# Patient Record
Sex: Female | Born: 1970 | Race: Black or African American | Hispanic: No | Smoking: Never smoker
Health system: Southern US, Community
[De-identification: ages and names within clinical notes are randomized; demographics above are authoritative.]

## PROBLEM LIST (undated history)

## (undated) DIAGNOSIS — O24419 Gestational diabetes mellitus in pregnancy, unspecified control: Secondary | ICD-10-CM

## (undated) HISTORY — DX: Gestational diabetes mellitus in pregnancy, unspecified control: O24.419

## (undated) HISTORY — PX: ABDOMINAL HYSTERECTOMY: SHX81

## (undated) HISTORY — PX: HYSTERECTOMY ABDOMINAL WITH SALPINGECTOMY: SHX6725

---

## 2015-10-07 ENCOUNTER — Other Ambulatory Visit: Payer: Self-pay | Admitting: Family Medicine

## 2015-10-07 DIAGNOSIS — Z1231 Encounter for screening mammogram for malignant neoplasm of breast: Secondary | ICD-10-CM

## 2015-10-21 ENCOUNTER — Ambulatory Visit
Admission: RE | Admit: 2015-10-21 | Discharge: 2015-10-21 | Disposition: A | Discharge status: Home / Self Care | Payer: 59 | Source: Ambulatory Visit | Attending: Family Medicine | Admitting: Family Medicine

## 2015-10-21 DIAGNOSIS — Z1231 Encounter for screening mammogram for malignant neoplasm of breast: Secondary | ICD-10-CM

## 2016-10-18 ENCOUNTER — Other Ambulatory Visit: Payer: Self-pay | Admitting: Family Medicine

## 2016-10-18 DIAGNOSIS — Z1231 Encounter for screening mammogram for malignant neoplasm of breast: Secondary | ICD-10-CM

## 2016-10-25 ENCOUNTER — Ambulatory Visit
Admission: RE | Admit: 2016-10-25 | Discharge: 2016-10-25 | Disposition: A | Discharge status: Home / Self Care | Payer: 59 | Source: Ambulatory Visit | Attending: Family Medicine | Admitting: Family Medicine

## 2016-10-25 DIAGNOSIS — Z1231 Encounter for screening mammogram for malignant neoplasm of breast: Secondary | ICD-10-CM

## 2017-10-23 ENCOUNTER — Other Ambulatory Visit: Payer: Self-pay | Admitting: Family Medicine

## 2017-10-23 DIAGNOSIS — Z139 Encounter for screening, unspecified: Secondary | ICD-10-CM

## 2017-11-12 ENCOUNTER — Ambulatory Visit
Admission: RE | Admit: 2017-11-12 | Discharge: 2017-11-12 | Disposition: A | Discharge status: Home / Self Care | Payer: 59 | Source: Ambulatory Visit | Attending: Family Medicine | Admitting: Family Medicine

## 2017-11-12 DIAGNOSIS — Z139 Encounter for screening, unspecified: Secondary | ICD-10-CM

## 2018-11-11 ENCOUNTER — Other Ambulatory Visit: Payer: Self-pay | Admitting: Family Medicine

## 2018-11-11 DIAGNOSIS — Z1231 Encounter for screening mammogram for malignant neoplasm of breast: Secondary | ICD-10-CM

## 2018-11-18 ENCOUNTER — Ambulatory Visit
Admission: RE | Admit: 2018-11-18 | Discharge: 2018-11-18 | Disposition: A | Discharge status: Home / Self Care | Payer: 59 | Source: Ambulatory Visit | Attending: Family Medicine | Admitting: Family Medicine

## 2018-11-18 DIAGNOSIS — Z1231 Encounter for screening mammogram for malignant neoplasm of breast: Secondary | ICD-10-CM

## 2019-10-20 ENCOUNTER — Other Ambulatory Visit: Payer: Self-pay | Admitting: Family Medicine

## 2019-10-20 DIAGNOSIS — Z1231 Encounter for screening mammogram for malignant neoplasm of breast: Secondary | ICD-10-CM

## 2019-11-19 ENCOUNTER — Other Ambulatory Visit: Payer: Self-pay

## 2019-11-19 ENCOUNTER — Ambulatory Visit
Admission: RE | Admit: 2019-11-19 | Discharge: 2019-11-19 | Disposition: A | Discharge status: Home / Self Care | Payer: 59 | Source: Ambulatory Visit | Attending: Family Medicine | Admitting: Family Medicine

## 2019-11-19 DIAGNOSIS — Z1231 Encounter for screening mammogram for malignant neoplasm of breast: Secondary | ICD-10-CM

## 2020-03-31 ENCOUNTER — Ambulatory Visit: Payer: 59 | Admitting: Cardiology

## 2020-03-31 ENCOUNTER — Encounter: Payer: Self-pay | Admitting: Cardiology

## 2020-03-31 ENCOUNTER — Other Ambulatory Visit: Payer: Self-pay

## 2020-03-31 VITALS — BP 120/59 | HR 74 | Resp 17 | Ht 60.0 in | Wt 180.4 lb

## 2020-03-31 DIAGNOSIS — Z8249 Family history of ischemic heart disease and other diseases of the circulatory system: Secondary | ICD-10-CM

## 2020-03-31 DIAGNOSIS — R072 Precordial pain: Secondary | ICD-10-CM

## 2020-03-31 NOTE — Progress Notes (Signed)
Date:  03/31/2020   ID:  Jo Williamson, DOB [birth year], MRN [id number]  PCP:  Jo Landsman, MD  Cardiologist:  Jo Kras, DO, [facility] (established care 03/30/2020)  REASON FOR CONSULT: Chest pain  REQUESTING PHYSICIAN:  Jo Williamson, [address] [place],  [place] [address]  Chief Complaint  Patient presents with  . Chest Pain  . left arm pain  . New Patient (Initial Visit)    HPI  Jo Williamson is a 49 y.o. female who presents to the office with a chief complaint of " chest pain." She is referred to the office at the request of Jo Landsman, MD. Patient's past medical history and cardiovascular risk factors include: Family history of premature coronary artery disease, obesity due to excess calories.  Patient is referred to the office at the request of her primary care provider for evaluation of chest pain and left arm pain. Patient states that she started having symptoms back in June when she woke up with left-sided chest pain and left arm pain.  She describes the chest discomfort as a sharp-like pain, 10 out of 10 in intensity lasted for couple hours and self-limited.  No improving or worsening factors.  Despite this happening in July 2021 she continues to have residual left arm pain around the left elbow.  She had mentioned this to her primary care provider and was referred to cardiology for further evaluation.  Functionally patient is able to go up flights of stairs and does go out for when she is at work and after coming home.  She does not have effort related chest pain or shortness of breath.  No significant decrease in overall exercise endurance.  Mom passed away from MI at age of 15 back in 2011.   Denies prior history of coronary artery disease, myocardial infarction, congestive heart failure, deep venous thrombosis, pulmonary embolism, stroke, transient ischemic attack.  FUNCTIONAL STATUS: Tries to walk daily during work breaks and after work [place]  [place].    ALLERGIES: Allergies  Allergen Reactions  . Shrimp [Shellfish Allergy] Hives    MEDICATION LIST PRIOR TO VISIT: Current Meds  Medication Sig  . Ascorbic Acid (VITAMIN C) 1000 MG tablet Take 1,000 mg by mouth daily.  . Multiple Vitamins-Minerals (WOMENS MULTIVITAMIN PO) Take 1 tablet by mouth daily.     PAST MEDICAL HISTORY: Past Medical History:  Diagnosis Date  . Gestational diabetes     PAST SURGICAL HISTORY: Past Surgical History:  Procedure Laterality Date  . CESAREAN SECTION    . HYSTERECTOMY ABDOMINAL WITH SALPINGECTOMY      FAMILY HISTORY: The patient family history includes Diabetes in her father; Heart attack in her mother.  SOCIAL HISTORY:  The patient  reports that she has never smoked. She has never used smokeless tobacco. She reports current alcohol use. She reports previous drug use.  REVIEW OF SYSTEMS: Review of Systems  Constitutional: Negative for chills and fever.  HENT: Negative for hoarse voice and nosebleeds.   Eyes: Negative for discharge, double vision and pain.  Cardiovascular: Negative for chest pain, claudication, dyspnea on exertion, leg swelling, near-syncope, orthopnea, palpitations, paroxysmal nocturnal dyspnea and syncope.       Leg arm pain.   Respiratory: Negative for hemoptysis and shortness of breath.   Musculoskeletal: Negative for muscle cramps and myalgias.  Gastrointestinal: Negative for abdominal pain, constipation, diarrhea, hematemesis, hematochezia, melena, nausea and vomiting.  Neurological: Negative for dizziness and light-headedness.    PHYSICAL EXAM: Vitals  with BMI 03/31/2020  Height '5\' 0"'   Weight 180 lbs 6 oz  BMI 41.74  Systolic 081  Diastolic 59  Pulse 74    CONSTITUTIONAL: Well-developed and well-nourished. No acute distress.  SKIN: Skin is warm and dry. No rash noted. No cyanosis. No pallor. No jaundice HEAD: Normocephalic and atraumatic.  EYES: No scleral icterus MOUTH/THROAT: Moist oral  membranes.  NECK: No JVD present. No thyromegaly noted. No carotid bruits  LYMPHATIC: No visible cervical adenopathy.  CHEST Normal respiratory effort. No intercostal retractions  LUNGS: Clear to auscultation bilaterally. No stridor. No wheezes. No rales.  CARDIOVASCULAR: Regular rate and rhythm, positive S1-S2, no murmurs rubs or gallops appreciated. ABDOMINAL: No apparent ascites.  EXTREMITIES: No peripheral edema  HEMATOLOGIC: No significant bruising NEUROLOGIC: Oriented to person, place, and time. Nonfocal. Normal muscle tone.  PSYCHIATRIC: Normal mood and affect. Normal behavior. Cooperative  CARDIAC DATABASE: EKG: 03/31/2020: Sinus  Rhythm, 75bpm, normal axis, without underlying ischemia or injury pattern.   Echocardiogram: None  Stress Testing: None  Heart Catheterization: None  LABORATORY DATA: External Labs: Collected: 03/04/2020 Creatinine 0.66 mg/dL. eGFR: 119 mL/min per 1.73 m Lipid profile: Total cholesterol 233, triglycerides 67, HDL 58, LDL 159, non-HDL 175 TSH: 3.02  IMPRESSION:    ICD-10-CM   1. Precordial pain  R07.2 EKG 12-Lead    CT CARDIAC SCORING    PCV ECHOCARDIOGRAM COMPLETE    PCV CARDIAC STRESS TEST  2. Family history of premature CAD  Z82.49 CT CARDIAC SCORING  3. Class 2 severe obesity due to excess calories with serious comorbidity and body mass index (BMI) of 35.0 to 35.9 in adult [facility])  E66.01    Z68.35      RECOMMENDATIONS: Jo Williamson is a 49 y.o. female whose past medical history and cardiac risk factors include: Family history of premature coronary artery disease, obesity due to excess calories.  Precordial chest pain and left arm pain:  Patient symptoms of chest discomfort appear to be atypical in nature.    EKG shows normal sinus rhythm without underlying ischemia or injury pattern.  Echocardiogram will be ordered to evaluate for structural heart disease and left ventricular systolic function.  Plan exercise treadmill  stress test to evaluate for exercise-induced ischemia.  Given her family history of premature coronary artery disease recommend coronary artery calcification score for risk stratification.  We discussed that her cholesterol levels are not optimized. Her LDL is 159, non-HDL 175. However, her estimated 10-year risk of ASCVD proximally 1.8% based on the current parameters.  Discussed improving lifestyle modifications to improve her modifiable cardiovascular risk factors.  Family history of premature CAD: See above  Obesity, due to excess calories: Body mass index is 35.23 kg/m. . I reviewed with the patient the importance of diet, regular physical activity/exercise, weight loss.   . Patient is educated on increas good how you ing physical activity gradually as tolerated.  With the goal of moderate intensity exercise for 30 minutes a day 5 days a week.  FINAL MEDICATION LIST END OF ENCOUNTER: No orders of the defined types were placed in this encounter.   There are no discontinued medications.   Current Outpatient Medications:  .  Ascorbic Acid (VITAMIN C) 1000 MG tablet, Take 1,000 mg by mouth daily., Disp: , Rfl:  .  Multiple Vitamins-Minerals (WOMENS MULTIVITAMIN PO), Take 1 tablet by mouth daily., Disp: , Rfl:   Orders Placed This Encounter  Procedures  . CT CARDIAC SCORING  . PCV CARDIAC STRESS TEST  .  EKG 12-Lead  . PCV ECHOCARDIOGRAM COMPLETE    There are no Patient Instructions on file for this visit.   --Continue cardiac medications as reconciled in final medication list. --No follow-ups on file. Or sooner if needed. --Continue follow-up with your primary care physician regarding the management of your other chronic comorbid conditions.  Patient's questions and concerns were addressed to her satisfaction. She voices understanding of the instructions provided during this encounter.   This note was created using a voice recognition software as a result there may be  grammatical errors inadvertently enclosed that do not reflect the nature of this encounter. Every attempt is made to correct such errors.  Jo Williamson, [place], [facility]  Pager: [phone] Office: [phone]

## 2020-04-05 ENCOUNTER — Other Ambulatory Visit: Payer: Self-pay

## 2020-04-05 ENCOUNTER — Ambulatory Visit: Payer: 59

## 2020-04-05 DIAGNOSIS — R072 Precordial pain: Secondary | ICD-10-CM

## 2020-04-13 ENCOUNTER — Telehealth: Payer: Self-pay

## 2020-04-13 NOTE — Telephone Encounter (Signed)
Called pt to inform her about her echo results. Pt understood.

## 2020-04-13 NOTE — Telephone Encounter (Signed)
-----   Message from [place], [place] sent at 04/11/2020 11:16 PM EDT ----- The left ventricular ejection fraction or pumping activity of the heart is within the normal limit. Details will be reviewed at the next office visit.

## 2020-04-15 ENCOUNTER — Other Ambulatory Visit: Payer: Self-pay

## 2020-04-15 ENCOUNTER — Ambulatory Visit: Payer: 59 | Admitting: Cardiology

## 2020-04-15 ENCOUNTER — Encounter: Payer: Self-pay | Admitting: Cardiology

## 2020-04-15 VITALS — BP 106/55 | HR 63 | Resp 15 | Ht 64.0 in | Wt 182.0 lb

## 2020-04-15 DIAGNOSIS — R072 Precordial pain: Secondary | ICD-10-CM

## 2020-04-15 DIAGNOSIS — E6609 Other obesity due to excess calories: Secondary | ICD-10-CM

## 2020-04-15 DIAGNOSIS — Z8249 Family history of ischemic heart disease and other diseases of the circulatory system: Secondary | ICD-10-CM

## 2020-04-15 DIAGNOSIS — Z712 Person consulting for explanation of examination or test findings: Secondary | ICD-10-CM

## 2020-04-15 NOTE — Progress Notes (Signed)
Date:  04/15/2020   ID:  Vena Rua, DOB 02/25/1971, MRN [id number]  PCP:  Lin Landsman, MD  Cardiologist:  Rex Kras, DO, [facility] (established care 03/30/2020)  Date: 04/15/20 Last Office Visit: 03/31/2020  Chief Complaint  Patient presents with  . Follow-up    2 week  . Results    HPI  BINNIE DROESSLER is a 49 y.o. female who presents to the office with a chief complaint of " reevaluation of chest pain and review test results." Patient's past medical history and cardiovascular risk factors include: Family history of premature coronary artery disease, obesity due to excess calories.  Patient is referred to the office at the request of her primary care provider for evaluation of chest pain and left arm pain.  At last office visit patient was informed that her symptoms were atypical in nature but given her cardiovascular risk factors more importantly including family history of premature coronary artery disease ischemic evaluation was warranted.  Since last office visit patient states her symptoms of chest pain have resolved no further reoccurrence.  Her echocardiogram noted preserved left ventricular systolic function without any significant valvular heart disease details noted below for further reference.  Patient's coronary artery calcification score is 0.  And her exercise treadmill stress test was submaximal as she did not achieve 85% age-predicted maximum heart rate but based on the workload performed stress ECG was negative for ischemia.  Mom passed away from MI at age of 83 back in 2011.   Denies prior history of coronary artery disease, myocardial infarction, congestive heart failure, deep venous thrombosis, pulmonary embolism, stroke, transient ischemic attack.  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    ALLERGIES: Allergies  Allergen Reactions  . Shrimp [Shellfish Allergy] Hives    MEDICATION LIST PRIOR TO  VISIT: Current Meds  Medication Sig  . Ascorbic Acid (VITAMIN C) 1000 MG tablet Take 1,000 mg by mouth daily.  . Multiple Vitamins-Minerals (WOMENS MULTIVITAMIN PO) Take 1 tablet by mouth daily.  . Nutritional Supplements (IMMUNE ENHANCE PO) Take by mouth.     PAST MEDICAL HISTORY: Past Medical History:  Diagnosis Date  . Gestational diabetes     PAST SURGICAL HISTORY: Past Surgical History:  Procedure Laterality Date  . CESAREAN SECTION    . HYSTERECTOMY ABDOMINAL WITH SALPINGECTOMY      FAMILY HISTORY: The patient family history includes Diabetes in her father; Heart attack in her mother.  SOCIAL HISTORY:  The patient  reports that she has never smoked. She has never used smokeless tobacco. She reports current alcohol use. She reports previous drug use.  REVIEW OF SYSTEMS: Review of Systems  Constitutional: Negative for chills and fever.  HENT: Negative for hoarse voice and nosebleeds.   Eyes: Negative for discharge, double vision and pain.  Cardiovascular: Negative for chest pain, claudication, dyspnea on exertion, leg swelling, near-syncope, orthopnea, palpitations, paroxysmal nocturnal dyspnea and syncope.  Respiratory: Negative for hemoptysis and shortness of breath.   Musculoskeletal: Negative for muscle cramps and myalgias.  Gastrointestinal: Negative for abdominal pain, constipation, diarrhea, hematemesis, hematochezia, melena, nausea and vomiting.  Neurological: Negative for dizziness and light-headedness.    PHYSICAL EXAM: Vitals with BMI 04/15/2020 03/31/2020  Height '5\' 4"'  '5\' 0"'   Weight 182 lbs 180 lbs 6 oz  BMI 51.88 41.66  Systolic 063 016  Diastolic 55 59  Pulse 63 74   CONSTITUTIONAL: Well-developed and well-nourished. No acute distress.  SKIN: Skin  is warm and dry. No rash noted. No cyanosis. No pallor. No jaundice HEAD: Normocephalic and atraumatic.  EYES: No scleral icterus MOUTH/THROAT: Moist oral membranes.  NECK: No JVD present. No thyromegaly  noted. No carotid bruits  LYMPHATIC: No visible cervical adenopathy.  CHEST Normal respiratory effort. No intercostal retractions  LUNGS: Clear to auscultation bilaterally. No stridor. No wheezes. No rales.  CARDIOVASCULAR: Regular rate and rhythm, positive S1-S2, no murmurs rubs or gallops appreciated. ABDOMINAL: No apparent ascites.  EXTREMITIES: No peripheral edema  HEMATOLOGIC: No significant bruising NEUROLOGIC: Oriented to person, place, and time. Nonfocal. Normal muscle tone.  PSYCHIATRIC: Normal mood and affect. Normal behavior. Cooperative  CARDIAC DATABASE: EKG: 03/31/2020: Sinus  Rhythm, 75bpm, normal axis, without underlying ischemia or injury pattern.   Echocardiogram: 04/05/2020: LVEF 60-65%, LV cavity normal in size, mild LVH, normal wall motion, no significant valvular abnormality.  Stress Testing: Exercise treadmill stress test 04/05/2020:  Exercise treadmill stress test performed using Bruce protocol. Patient reached 7.4 METS, and 76% of age predicted maximum heart rate. Exercise capacity was low. No chest pain reported. Attenuated heart rate and blood pressure response to exercise.  Stress EKG with submaximal exercise revealed no ischemic changes.  Inconclusive study. Consider alternate diagnostic tests, if clinically indicated.   Coronary artery calcification scoring performed on April 01, 2020 at [facility]: Total calcium score:0 AU.  Impression: No identifiable calcified coronary artery plaque. Very low, generally less than 5% risk of coronary artery disease.   Heart Catheterization: None  LABORATORY DATA: External Labs: Collected: 03/04/2020 Creatinine 0.66 mg/dL. eGFR: 119 mL/min per 1.73 m Lipid profile: Total cholesterol 233, triglycerides 67, HDL 58, LDL 159, non-HDL 175 TSH: 3.02  IMPRESSION:    ICD-10-CM   1. Precordial pain  R07.2   2. Encounter to discuss test results  Z71.2   3. Family history of premature CAD  Z82.49 Lipid Panel With  LDL/HDL Ratio    Lipid Panel With LDL/HDL Ratio  4. Class 1 obesity due to excess calories with serious comorbidity and body mass index (BMI) of 31.0 to 31.9 in adult  E66.09    Z68.31      RECOMMENDATIONS: ADIN LARICCIA is a 49 y.o. female whose past medical history and cardiac risk factors include: Family history of premature coronary artery disease, obesity due to excess calories.  Precordial chest pain and left arm pain:  Since last office visit patient symptoms of chest pain and arm pain have resolved.  No recurrence of her symptoms.  Patient is informed that her total calcium score is 0, preserved LVEF without significant valvular heart disease per echocardiogram.  Patient is informed that her exercise treadmill stress test was submaximal as per heart rate did not achieve 85% of maximum predicted heart rate.  However based on the workload performed stress ECG was negative for ischemia.  We did discuss redoing another stress test such as either stress echo or nuclear stress test as the GXT was inconclusive.  However, since her symptoms have resolved since last visit and her LVEF is preserved and calcium score is 0 this shared decision was to have her increase her physical activity and reevaluate.    Patient understands that if she has recurrence of symptoms that increase in intensity, frequency, duration or has typical chest pain she is to call the office or go to the closest ER via EMS.  We discussed that her cholesterol levels are not optimized. Her LDL is 159, non-HDL 175. However, her estimated 10-year risk of  ASCVD proximally 1.8% based on the current parameters.  Discussed improving lifestyle modifications to improve her modifiable cardiovascular risk factors.  Family history of premature CAD:  Coronary artery calcification scoring results reviewed with the patient.  Her total coronary calcification score is 0 per report.  Educated on the importance of improving her modifiable  cardiovascular risk factors.  Her last lipid profile noted her LDL to be 159 and non-HDL to be 175.  Recommend repeating lipid profile prior to the next office visit.  Obesity, due to excess calories: Body mass index is 31.24 kg/m. . I reviewed with the patient the importance of diet, regular physical activity/exercise, weight loss.   . Patient is educated on increas good how you ing physical activity gradually as tolerated.  With the goal of moderate intensity exercise for 30 minutes a day 5 days a week.    FINAL MEDICATION LIST END OF ENCOUNTER: No orders of the defined types were placed in this encounter.   There are no discontinued medications.   Current Outpatient Medications:  .  Ascorbic Acid (VITAMIN C) 1000 MG tablet, Take 1,000 mg by mouth daily., Disp: , Rfl:  .  Multiple Vitamins-Minerals (WOMENS MULTIVITAMIN PO), Take 1 tablet by mouth daily., Disp: , Rfl:  .  Nutritional Supplements (IMMUNE ENHANCE PO), Take by mouth., Disp: , Rfl:   Orders Placed This Encounter  Procedures  . Lipid Panel With LDL/HDL Ratio    There are no Patient Instructions on file for this visit.   --Continue cardiac medications as reconciled in final medication list. --Return in about 6 months (around 10/20/2020) for re-evaluation of symptoms., Lipid follow-up.. Or sooner if needed. --Continue follow-up with your primary care physician regarding the management of your other chronic comorbid conditions.  Patient's questions and concerns were addressed to her satisfaction. She voices understanding of the instructions provided during this encounter.   This note was created using a voice recognition software as a result there may be grammatical errors inadvertently enclosed that do not reflect the nature of this encounter. Every attempt is made to correct such errors.  Rex Kras, [place], [facility]  Pager: [phone] Office: [phone]

## 2020-10-12 LAB — LIPID PANEL WITH LDL/HDL RATIO
Cholesterol, Total: 210 mg/dL — ABNORMAL HIGH (ref 100–199)
HDL: 55 mg/dL (ref >39)
LDL Chol Calc (NIH): 144 mg/dL — ABNORMAL HIGH (ref 0–99)
LDL/HDL Ratio: 2.6 ratio (ref 0.0–3.2)
Triglycerides: 62 mg/dL (ref 0–149)
VLDL Cholesterol Cal: 11 mg/dL (ref 5–40)

## 2020-10-13 NOTE — Progress Notes (Signed)
Patient is aware 

## 2020-10-18 ENCOUNTER — Ambulatory Visit: Payer: 59 | Admitting: Cardiology

## 2020-10-18 ENCOUNTER — Encounter: Payer: Self-pay | Admitting: Cardiology

## 2020-10-18 ENCOUNTER — Other Ambulatory Visit: Payer: Self-pay

## 2020-10-18 VITALS — BP 107/56 | HR 85 | Temp 97.7°F | Resp 17 | Ht 64.0 in | Wt 181.0 lb

## 2020-10-18 DIAGNOSIS — R072 Precordial pain: Secondary | ICD-10-CM

## 2020-10-18 DIAGNOSIS — E6609 Other obesity due to excess calories: Secondary | ICD-10-CM

## 2020-10-18 DIAGNOSIS — Z712 Person consulting for explanation of examination or test findings: Secondary | ICD-10-CM

## 2020-10-18 DIAGNOSIS — Z8249 Family history of ischemic heart disease and other diseases of the circulatory system: Secondary | ICD-10-CM

## 2020-10-18 NOTE — Progress Notes (Signed)
Date:  10/18/2020   ID:  Jo Williamson, DOB 08/14/1971, MRN [id number]  PCP:  Lin Landsman, MD  Cardiologist:  Rex Kras, DO, [facility] (established care 03/30/2020)  Date: 10/18/20 Last Office Visit: 04/15/2020  Chief Complaint  Patient presents with  . Precordial pain  . Follow-up    6 month    HPI  Jo Williamson is a 50 y.o. female who presents to the office with a chief complaint of " 82-monthfollow-up to reevaluate chest pain and review lab results." Patient's past medical history and cardiovascular risk factors include: Family history of premature coronary artery disease, obesity due to excess calories.  Patient is referred to the office at the request of her primary care provider for evaluation of chest pain and left arm pain.  Patient presents for 654-monthollow-up to reevaluate chest pain.  During prior office visit patient had symptoms of noncardiac chest pain but given her cardiac risk factors shared decision was to proceed with ischemic evaluation.  Echocardiogram noted preserved LVEF without any significant valvular heart disease.  Total coronary artery calcium score was 0.  And she had undergone an exercise treadmill stress test which was submaximal as she did not achieve 85% of maximum predicted heart rate but based on the workload performed stress ECG was negative for ischemia.  The shared decision was to continue with lifestyle modifications and increase physical activity as tolerated and to reevaluate in 6 months.  She is now here for follow-up.  Since last office visit patient states that she is doing well from a cardiovascular standpoint.  She walks at least 3 miles a day.  Does not complain of any chest pain or shortness of breath at rest or with effort related activities.  Most recent lipid profile reviewed with the patient during today's encounter.  Mom passed away from MI at age of 6042ack in 2011.   FUNCTIONAL STATUS: Walks atleast 3 miles a  day.  ALLERGIES: Allergies  Allergen Reactions  . Shrimp [Shellfish Allergy] Hives    MEDICATION LIST PRIOR TO VISIT: Current Meds  Medication Sig  . Ascorbic Acid (VITAMIN C) 1000 MG tablet Take 1,000 mg by mouth daily.  . Multiple Vitamins-Minerals (WOMENS MULTIVITAMIN PO) Take 1 tablet by mouth daily.  . Nutritional Supplements (IMMUNE ENHANCE PO) Take by mouth.     PAST MEDICAL HISTORY: Past Medical History:  Diagnosis Date  . Gestational diabetes     PAST SURGICAL HISTORY: Past Surgical History:  Procedure Laterality Date  . CESAREAN SECTION    . HYSTERECTOMY ABDOMINAL WITH SALPINGECTOMY      FAMILY HISTORY: The patient family history includes Diabetes in her father; Heart attack in her mother.  SOCIAL HISTORY:  The patient  reports that she has never smoked. She has never used smokeless tobacco. She reports current alcohol use. She reports previous drug use.  REVIEW OF SYSTEMS: Review of Systems  Constitutional: Negative for chills and fever.  HENT: Negative for hoarse voice and nosebleeds.   Eyes: Negative for discharge, double vision and pain.  Cardiovascular: Negative for chest pain, claudication, dyspnea on exertion, leg swelling, near-syncope, orthopnea, palpitations, paroxysmal nocturnal dyspnea and syncope.  Respiratory: Negative for hemoptysis and shortness of breath.   Musculoskeletal: Negative for muscle cramps and myalgias.  Gastrointestinal: Negative for abdominal pain, constipation, diarrhea, hematemesis, hematochezia, melena, nausea and vomiting.  Neurological: Negative for dizziness and light-headedness.    PHYSICAL EXAM: Vitals with BMI 10/18/2020 04/15/2020 03/31/2020  Height '5\' 4"'  '5\' 4"'   '5\' 0"'   Weight 181 lbs 182 lbs 180 lbs 6 oz  BMI 31.05 87.56 43.32  Systolic 951 884 166  Diastolic 56 55 59  Pulse 85 63 74   CONSTITUTIONAL: Well-developed and well-nourished. No acute distress.  SKIN: Skin is warm and dry. No rash noted. No cyanosis. No  pallor. No jaundice HEAD: Normocephalic and atraumatic.  EYES: No scleral icterus MOUTH/THROAT: Moist oral membranes.  NECK: No JVD present. No thyromegaly noted. No carotid bruits  LYMPHATIC: No visible cervical adenopathy.  CHEST Normal respiratory effort. No intercostal retractions  LUNGS: Clear to auscultation bilaterally. No stridor. No wheezes. No rales.  CARDIOVASCULAR: Regular rate and rhythm, positive S1-S2, no murmurs rubs or gallops appreciated. ABDOMINAL: No apparent ascites.  EXTREMITIES: No peripheral edema  HEMATOLOGIC: No significant bruising NEUROLOGIC: Oriented to person, place, and time. Nonfocal. Normal muscle tone.  PSYCHIATRIC: Normal mood and affect. Normal behavior. Cooperative  CARDIAC DATABASE: EKG: 10/18/2020: Sinus  Rhythm, 89bpm, normal axis, without underlying injury pattern.  Echocardiogram: 04/05/2020: LVEF 60-65%, LV cavity normal in size, mild LVH, normal wall motion, no significant valvular abnormality.  Stress Testing: Exercise treadmill stress test 04/05/2020:  Exercise treadmill stress test performed using Bruce protocol. Patient reached 7.4 METS, and 76% of age predicted maximum heart rate. Exercise capacity was low. No chest pain reported. Attenuated heart rate and blood pressure response to exercise.  Stress EKG with submaximal exercise revealed no ischemic changes.  Inconclusive study. Consider alternate diagnostic tests, if clinically indicated.   Coronary artery calcification scoring performed on April 01, 2020 at [facility]: Total calcium score:0 AU.  Impression: No identifiable calcified coronary artery plaque. Very low, generally less than 5% risk of coronary artery disease.   Heart Catheterization: None  LABORATORY DATA: External Labs: Collected: 03/04/2020 Creatinine 0.66 mg/dL. eGFR: 119 mL/min per 1.73 m Lipid profile: Total cholesterol 233, triglycerides 67, HDL 58, LDL 159, non-HDL 175 TSH: 3.02  IMPRESSION:     ICD-10-CM   1. Precordial pain  R07.2 EKG 12-Lead  2. Family history of premature CAD  Z82.49 Lipid Panel With LDL/HDL Ratio    LDL cholesterol, direct  3. Class 1 obesity due to excess calories with serious comorbidity and body mass index (BMI) of 31.0 to 31.9 in adult  E66.09    Z68.31   4. Encounter to discuss test results  Z71.2      RECOMMENDATIONS: Jo Williamson is a 50 y.o. female whose past medical history and cardiac risk factors include: Family history of premature coronary artery disease, obesity due to excess calories.  Precordial chest pain and left arm pain: Resolved  Patient has undergone ischemic evaluation in the past including an exercise treadmill stress test, echocardiogram, and coronary calcification scoring.  Since last visit patient's functional status is improved as she is now walking at least 3 miles a day.  She has not had any reoccurrence of chest pain or anginal equivalent.  She is focusing on lifestyle modifications.  Family history of premature CAD:  Coronary artery calcification scoring results reviewed with the patient.  Her total coronary calcification score is 0 per report.  Educated on the importance of improving her modifiable cardiovascular risk factors.  Most recent lipid profile from January 2022 reviewed.  Patient's non-HDL has improved from 175 down to 144, and LDL was 159 now 142.  Patient's estimated 10-year risk of ASCVD is 1.1%..  Based on guideline recommendations no statins recommended at this time.  Patient is encouraged to continue with improving her  modifiable cardiovascular risk factors.    Obesity, due to excess calories: Body mass index is 31.07 kg/m. . I reviewed with the patient the importance of diet, regular physical activity/exercise, weight loss.   . Patient is educated on increas good how you ing physical activity gradually as tolerated.  With the goal of moderate intensity exercise for 30 minutes a day 5 days a week.     No changes made during today's office encounter.  Total visit encounter: 20 minutes.  FINAL MEDICATION LIST END OF ENCOUNTER: No orders of the defined types were placed in this encounter.   There are no discontinued medications.   Current Outpatient Medications:  .  Ascorbic Acid (VITAMIN C) 1000 MG tablet, Take 1,000 mg by mouth daily., Disp: , Rfl:  .  Multiple Vitamins-Minerals (WOMENS MULTIVITAMIN PO), Take 1 tablet by mouth daily., Disp: , Rfl:  .  Nutritional Supplements (IMMUNE ENHANCE PO), Take by mouth., Disp: , Rfl:   Orders Placed This Encounter  Procedures  . Lipid Panel With LDL/HDL Ratio  . LDL cholesterol, direct  . EKG 12-Lead    There are no Patient Instructions on file for this visit.   --Continue cardiac medications as reconciled in final medication list. --Return in about 27 weeks (around 04/25/2021) for Follow up re-evaluation chest pain and lipid profile. . Or sooner if needed. --Continue follow-up with your primary care physician regarding the management of your other chronic comorbid conditions.  Patient's questions and concerns were addressed to her satisfaction. She voices understanding of the instructions provided during this encounter.   This note was created using a voice recognition software as a result there may be grammatical errors inadvertently enclosed that do not reflect the nature of this encounter. Every attempt is made to correct such errors.  Rex Kras, [place], [facility]  Pager: [phone] Office: [phone]

## 2020-10-19 ENCOUNTER — Other Ambulatory Visit: Payer: Self-pay

## 2020-10-19 DIAGNOSIS — Z8249 Family history of ischemic heart disease and other diseases of the circulatory system: Secondary | ICD-10-CM

## 2020-10-29 ENCOUNTER — Other Ambulatory Visit: Payer: Self-pay | Admitting: Family Medicine

## 2020-10-29 DIAGNOSIS — Z1231 Encounter for screening mammogram for malignant neoplasm of breast: Secondary | ICD-10-CM

## 2020-12-20 ENCOUNTER — Ambulatory Visit
Admission: RE | Admit: 2020-12-20 | Discharge: 2020-12-20 | Disposition: A | Discharge status: Home / Self Care | Payer: 59 | Source: Ambulatory Visit | Attending: Family Medicine | Admitting: Family Medicine

## 2020-12-20 ENCOUNTER — Other Ambulatory Visit: Payer: Self-pay

## 2020-12-20 DIAGNOSIS — Z1231 Encounter for screening mammogram for malignant neoplasm of breast: Secondary | ICD-10-CM

## 2021-04-13 LAB — LIPID PANEL WITH LDL/HDL RATIO
Cholesterol, Total: 224 mg/dL — ABNORMAL HIGH (ref 100–199)
HDL: 56 mg/dL (ref >39)
LDL Chol Calc (NIH): 154 mg/dL — ABNORMAL HIGH (ref 0–99)
LDL/HDL Ratio: 2.8 ratio (ref 0.0–3.2)
Triglycerides: 81 mg/dL (ref 0–149)
VLDL Cholesterol Cal: 14 mg/dL (ref 5–40)

## 2021-04-13 LAB — LDL CHOLESTEROL, DIRECT: LDL Direct: 154 mg/dL — ABNORMAL HIGH (ref 0–99)

## 2021-04-13 NOTE — Progress Notes (Signed)
Spoke to patient she is aware

## 2021-04-18 ENCOUNTER — Encounter: Payer: Self-pay | Admitting: Cardiology

## 2021-04-18 ENCOUNTER — Other Ambulatory Visit: Payer: Self-pay

## 2021-04-18 ENCOUNTER — Ambulatory Visit: Payer: 59 | Admitting: Cardiology

## 2021-04-18 VITALS — BP 106/54 | HR 70 | Temp 97.1°F | Resp 16 | Ht 64.0 in | Wt 181.0 lb

## 2021-04-18 DIAGNOSIS — E6609 Other obesity due to excess calories: Secondary | ICD-10-CM

## 2021-04-18 DIAGNOSIS — Z8249 Family history of ischemic heart disease and other diseases of the circulatory system: Secondary | ICD-10-CM

## 2021-04-18 DIAGNOSIS — R072 Precordial pain: Secondary | ICD-10-CM

## 2021-04-18 NOTE — Progress Notes (Signed)
Date:  04/18/2021   ID:  Vena Rua, DOB [birth year], MRN [id number]  PCP:  Lin Landsman, MD  Cardiologist:  Rex Kras, DO, [facility] (established care 03/30/2020)  Date: 04/18/21 Last Office Visit: 10/18/2020  Chief Complaint  Patient presents with   Precordial pain   lipid profile   Follow-up    HPI  Jo Williamson is a 50 y.o. female who presents to the office with a chief complaint of " 19-monthfollow-up for reevaluation of chest pain and lipid profile." Patient's past medical history and cardiovascular risk factors include: Family history of premature coronary artery disease, obesity due to excess calories.  Initially referred to the office by PCP for evaluation of chest pain and left arm pain.  In the past that she underwent an ischemic evaluation which noted preserved LVEF, without any significant valvular heart disease, a total CAC 0, exercise treadmill test test was submaximal but based on the workload performed stress ECG was negative for ischemia.  Since last office visit patient denies any chest pain or shortness of breath at rest or with effort related activities.  Her functional status remains relatively stable.  No hospitalizations or urgent care visits for cardiovascular symptoms.  Most recent labs from July 2022 reviewed with her and noted below for further reference.  Mom passed away from MI at age of 654back in 2011.   FUNCTIONAL STATUS: Walks atleast 3 miles a day.  ALLERGIES: Allergies  Allergen Reactions   Shrimp [Shellfish Allergy] Hives    MEDICATION LIST PRIOR TO VISIT: Current Meds  Medication Sig   Ascorbic Acid (VITAMIN C) 1000 MG tablet Take 1,000 mg by mouth daily.   Multiple Vitamins-Minerals (WOMENS MULTIVITAMIN PO) Take 1 tablet by mouth daily.     PAST MEDICAL HISTORY: Past Medical History:  Diagnosis Date   Gestational diabetes     PAST SURGICAL HISTORY: Past Surgical History:  Procedure Laterality Date   CESAREAN SECTION      HYSTERECTOMY ABDOMINAL WITH SALPINGECTOMY      FAMILY HISTORY: The patient family history includes Diabetes in her father; Heart attack in her mother.  SOCIAL HISTORY:  The patient  reports that she has never smoked. She has never used smokeless tobacco. She reports current alcohol use. She reports previous drug use.  REVIEW OF SYSTEMS: Review of Systems  Constitutional: Negative for chills and fever.  HENT:  Negative for hoarse voice and nosebleeds.   Eyes:  Negative for discharge, double vision and pain.  Cardiovascular:  Negative for chest pain, claudication, dyspnea on exertion, leg swelling, near-syncope, orthopnea, palpitations, paroxysmal nocturnal dyspnea and syncope.  Respiratory:  Negative for hemoptysis and shortness of breath.   Musculoskeletal:  Negative for muscle cramps and myalgias.  Gastrointestinal:  Negative for abdominal pain, constipation, diarrhea, hematemesis, hematochezia, melena, nausea and vomiting.  Neurological:  Negative for dizziness and light-headedness.   PHYSICAL EXAM: Vitals with BMI 04/18/2021 10/18/2020 04/15/2020  Height '5\' 4"'  '5\' 4"'  '5\' 4"'   Weight 181 lbs 181 lbs 182 lbs  BMI 31.05 388.28300.34 Systolic [id number] Diastolic 54 56 55  Pulse 70 85 63   CONSTITUTIONAL: Well-developed and well-nourished. No acute distress.  SKIN: Skin is warm and dry. No rash noted. No cyanosis. No pallor. No jaundice HEAD: Normocephalic and atraumatic.  EYES: No scleral icterus MOUTH/THROAT: Moist oral membranes.  NECK: No JVD present. No thyromegaly noted. No carotid bruits  LYMPHATIC: No visible cervical adenopathy.  CHEST Normal respiratory effort. No intercostal  retractions  LUNGS: Clear to auscultation bilaterally. No stridor. No wheezes. No rales.  CARDIOVASCULAR: Regular rate and rhythm, positive S1-S2, no murmurs rubs or gallops appreciated. ABDOMINAL: Soft, nontender, nondistended, positive bowel sounds in all 4 quadrants, no apparent ascites.   EXTREMITIES: 2+ bilateral DP/PT pulses, no peripheral edema  HEMATOLOGIC: No significant bruising NEUROLOGIC: Oriented to person, place, and time. Nonfocal. Normal muscle tone.  PSYCHIATRIC: Normal mood and affect. Normal behavior. Cooperative  CARDIAC DATABASE: EKG: 04/18/2021: Normal sinus rhythm, 64 bpm, normal axis, without underlying ischemia or injury pattern.    Echocardiogram: 04/05/2020: LVEF 60-65%, LV cavity normal in size, mild LVH, normal wall motion, no significant valvular abnormality.  Stress Testing: Exercise treadmill stress test 04/05/2020:  Exercise treadmill stress test performed using Bruce protocol.  Patient reached 7.4 METS, and 76% of age predicted maximum heart rate.  Exercise capacity was low.  No chest pain reported. Attenuated heart rate and blood pressure response to exercise.  Stress EKG with submaximal exercise revealed no ischemic changes.  Inconclusive study. Consider alternate diagnostic tests, if clinically indicated.   Coronary artery calcification scoring performed on April 01, 2020 at [facility]: Total calcium score:0 AU.  Impression: No identifiable calcified coronary artery plaque. Very low, generally less than 5% risk of coronary artery disease.   Heart Catheterization: None  LABORATORY DATA: External Labs: Collected: 03/04/2020 Creatinine 0.66 mg/dL. eGFR: 119 mL/min per 1.73 m Lipid profile: Total cholesterol 233, triglycerides 67, HDL 58, LDL 159, non-HDL 175 TSH: 3.02  Lab Results  Component Value Date   CHOL 224 (H) 04/12/2021   HDL 56 04/12/2021   LDLCALC 154 (H) 04/12/2021   LDLDIRECT 154 (H) 04/12/2021   TRIG 81 04/12/2021     IMPRESSION:    ICD-10-CM   1. Precordial pain  R07.2 EKG 12-Lead    2. Family history of premature CAD  Z82.49     3. Class 1 obesity due to excess calories with serious comorbidity and body mass index (BMI) of 31.0 to 31.9 in adult  E66.09    Z68.31        RECOMMENDATIONS: Jo Williamson  is a 50 y.o. female whose past medical history and cardiac risk factors include: Family history of premature coronary artery disease, obesity due to excess calories.  Precordial chest pain and left arm pain:  No reoccurrence since last office visit.   Has undergone an extensive ischemic evaluation as noted above.   EKG today is nonischemic.   No additional testing warranted at this time.  Family history of premature CAD: Total CAC score 0  Fasting lipid profile reviewed.  LDL is currently not at goal.  Her estimated 10-year risk of ASCVD is approximately 1.3%.   Educated on improving her modifiable cardiovascular risk factors, stricter diet, and considering medication such as Metamucil to help excretion of cholesterol and red yeast rice.   Obesity, due to excess calories: Body mass index is 31.07 kg/m. I reviewed with the patient the importance of diet, regular physical activity/exercise, weight loss.   Patient is educated on increas good how you ing physical activity gradually as tolerated.  With the goal of moderate intensity exercise for 30 minutes a day 5 days a week.    Patient is recommended to follow-up on an annual basis to help modify and optimize her cardiovascular risk factors and to address any questions or concerns that she may have in the interim.  Patient recommended for scheduled his yearly visit after her annual physical with her  PCP and to bring in the labs for reference.  FINAL MEDICATION LIST END OF ENCOUNTER: No orders of the defined types were placed in this encounter.   Medications Discontinued During This Encounter  Medication Reason   Nutritional Supplements (IMMUNE ENHANCE PO) Error     Current Outpatient Medications:    Ascorbic Acid (VITAMIN C) 1000 MG tablet, Take 1,000 mg by mouth daily., Disp: , Rfl:    Multiple Vitamins-Minerals (WOMENS MULTIVITAMIN PO), Take 1 tablet by mouth daily., Disp: , Rfl:   Orders Placed This Encounter  Procedures   EKG  12-Lead    There are no Patient Instructions on file for this visit.   --Continue cardiac medications as reconciled in final medication list. --Return in about 38 weeks (around 01/09/2022) for Follow up, Lipid. Or sooner if needed. --Continue follow-up with your primary care physician regarding the management of your other chronic comorbid conditions.  Patient's questions and concerns were addressed to her satisfaction. She voices understanding of the instructions provided during this encounter.   This note was created using a voice recognition software as a result there may be grammatical errors inadvertently enclosed that do not reflect the nature of this encounter. Every attempt is made to correct such errors.  Rex Kras, [place], [facility]  Pager: [phone] Office: [phone]

## 2021-12-15 ENCOUNTER — Encounter: Payer: Self-pay | Admitting: Internal Medicine

## 2021-12-15 ENCOUNTER — Ambulatory Visit: Payer: 59 | Attending: Internal Medicine | Admitting: Internal Medicine

## 2021-12-15 VITALS — BP 122/78 | HR 73 | Resp 16 | Ht 65.0 in | Wt 180.0 lb

## 2021-12-15 DIAGNOSIS — Z23 Encounter for immunization: Secondary | ICD-10-CM

## 2021-12-15 DIAGNOSIS — E663 Overweight: Secondary | ICD-10-CM

## 2021-12-15 DIAGNOSIS — R351 Nocturia: Secondary | ICD-10-CM | POA: Diagnosis not present

## 2021-12-15 DIAGNOSIS — Z7689 Persons encountering health services in other specified circumstances: Secondary | ICD-10-CM

## 2021-12-15 DIAGNOSIS — Z1231 Encounter for screening mammogram for malignant neoplasm of breast: Secondary | ICD-10-CM

## 2021-12-15 NOTE — Patient Instructions (Signed)
Healthy Eating ?Following a healthy eating pattern may help you to achieve and maintain a healthy body weight, reduce the risk of chronic disease, and live a long and productive life. It is important to follow a healthy eating pattern at an appropriate calorie level for your body. Your nutritional needs should be met primarily through food by choosing a variety of nutrient-rich foods. ?What are tips for following this plan? ?Reading food labels ?Read labels and choose the following: ?Reduced or low sodium. ?Juices with 100% fruit juice. ?Foods with low saturated fats and high polyunsaturated and monounsaturated fats. ?Foods with whole grains, such as whole wheat, cracked wheat, brown rice, and wild rice. ?Whole grains that are fortified with folic acid. This is recommended for women who are pregnant or who want to become pregnant. ?Read labels and avoid the following: ?Foods with a lot of added sugars. These include foods that contain brown sugar, corn sweetener, corn syrup, dextrose, fructose, glucose, high-fructose corn syrup, honey, invert sugar, lactose, malt syrup, maltose, molasses, raw sugar, sucrose, trehalose, or turbinado sugar. ?Do not eat more than the following amounts of added sugar per day: ?6 teaspoons (25 g) for women. ?9 teaspoons (38 g) for men. ?Foods that contain processed or refined starches and grains. ?Refined grain products, such as white flour, degermed cornmeal, white bread, and white rice. ?Shopping ?Choose nutrient-rich snacks, such as vegetables, whole fruits, and nuts. Avoid high-calorie and high-sugar snacks, such as potato chips, fruit snacks, and candy. ?Use oil-based dressings and spreads on foods instead of solid fats such as butter, stick margarine, or cream cheese. ?Limit pre-made sauces, mixes, and "instant" products such as flavored rice, instant noodles, and ready-made pasta. ?Try more plant-protein sources, such as tofu, tempeh, black beans, edamame, lentils, nuts, and  seeds. ?Explore eating plans such as the Mediterranean diet or vegetarian diet. ?Cooking ?Use oil to saut? or stir-fry foods instead of solid fats such as butter, stick margarine, or lard. ?Try baking, boiling, grilling, or broiling instead of frying. ?Remove the fatty part of meats before cooking. ?Steam vegetables in water or broth. ?Meal planning ? ?At meals, imagine dividing your plate into fourths: ?One-half of your plate is fruits and vegetables. ?One-fourth of your plate is whole grains. ?One-fourth of your plate is protein, especially lean meats, poultry, eggs, tofu, beans, or nuts. ?Include low-fat dairy as part of your daily diet. ?Lifestyle ?Choose healthy options in all settings, including home, work, school, restaurants, or stores. ?Prepare your food safely: ?Wash your hands after handling raw meats. ?Keep food preparation surfaces clean by regularly washing with hot, soapy water. ?Keep raw meats separate from ready-to-eat foods, such as fruits and vegetables. ?Cook seafood, meat, poultry, and eggs to the recommended internal temperature. ?Store foods at safe temperatures. In general: ?Keep cold foods at 40?F (4.4?C) or below. ?Keep hot foods at 140?F (60?C) or above. ?Keep your freezer at 0?F (-17.8?C) or below. ?Foods are no longer safe to eat when they have been between the temperatures of 40?-140?F (4.4-60?C) for more than 2 hours. ?What foods should I eat? ?Fruits ?Aim to eat 2 cup-equivalents of fresh, canned (in natural juice), or frozen fruits each day. Examples of 1 cup-equivalent of fruit include 1 small apple, 8 large strawberries, 1 cup canned fruit, ? cup dried fruit, or 1 cup 100% juice. ?Vegetables ?Aim to eat 2?-3 cup-equivalents of fresh and frozen vegetables each day, including different varieties and colors. Examples of 1 cup-equivalent of vegetables include 2 medium carrots, 2 cups raw,  leafy greens, 1 cup chopped vegetable (raw or cooked), or 1 medium baked potato. ?Grains ?Aim to  eat 6 ounce-equivalents of whole grains each day. Examples of 1 ounce-equivalent of grains include 1 slice of bread, 1 cup ready-to-eat cereal, 3 cups popcorn, or ? cup cooked rice, pasta, or cereal. ?Meats and other proteins ?Aim to eat 5-6 ounce-equivalents of protein each day. Examples of 1 ounce-equivalent of protein include 1 egg, 1/2 cup nuts or seeds, or 1 tablespoon (16 g) peanut butter. A cut of meat or fish that is the size of a deck of cards is about 3-4 ounce-equivalents. ?Of the protein you eat each week, try to have at least 8 ounces come from seafood. This includes salmon, trout, herring, and anchovies. ?Dairy ?Aim to eat 3 cup-equivalents of fat-free or low-fat dairy each day. Examples of 1 cup-equivalent of dairy include 1 cup (240 mL) milk, 8 ounces (250 g) yogurt, 1? ounces (44 g) natural cheese, or 1 cup (240 mL) fortified soy milk. ?Fats and oils ?Aim for about 5 teaspoons (21 g) per day. Choose monounsaturated fats, such as canola and olive oils, avocados, peanut butter, and most nuts, or polyunsaturated fats, such as sunflower, corn, and soybean oils, walnuts, pine nuts, sesame seeds, sunflower seeds, and flaxseed. ?Beverages ?Aim for six 8-oz glasses of water per day. Limit coffee to three to five 8-oz cups per day. ?Limit caffeinated beverages that have added calories, such as soda and energy drinks. ?Limit alcohol intake to no more than 1 drink a day for nonpregnant women and 2 drinks a day for men. One drink equals 12 oz of beer (355 mL), 5 oz of wine (148 mL), or 1? oz of hard liquor (44 mL). ?Seasoning and other foods ?Avoid adding excess amounts of salt to your foods. Try flavoring foods with herbs and spices instead of salt. ?Avoid adding sugar to foods. ?Try using oil-based dressings, sauces, and spreads instead of solid fats. ?This information is based on general [place] nutrition guidelines. For more information, visit [employer]. Exact amounts may vary based on your nutrition  needs. ?Summary ?A healthy eating plan may help you to maintain a healthy weight, reduce the risk of chronic diseases, and stay active throughout your life. ?Plan your meals. Make sure you eat the right portions of a variety of nutrient-rich foods. ?Try baking, boiling, grilling, or broiling instead of frying. ?Choose healthy options in all settings, including home, work, school, restaurants, or stores. ?This information is not intended to replace advice given to you by your health care provider. Make sure you discuss any questions you have with your health care provider. ?Document Revised: 04/26/2021 Document Reviewed: 04/26/2021 ?Elsevier Patient Education ? [place]. ? ?

## 2021-12-15 NOTE — Progress Notes (Signed)
? ? ?Patient ID: Jo Williamson, female    DOB: March 16, 1971  MRN: [id number] ? ?CC: New Patient (Initial Visit) ? ? ?Subjective: ?Jo Williamson is a 51 y.o. female who presents for new pt visit ?Her concerns today include:  ? ?Pt here to est care.   ?PCP was Dr. Jackson Latino.  Last seen over 1 yr ago.  Changed because she did not feel she was getting the care that she needed and poor customer service. ?No chronic medical conditions.  Strong fhx of premature heart ds- maternal grandmother died in her 28s and mom died at 77 from massive heart attack.  Pt has been seeing cardiologist Dr. Terri Skains, once a yr because of this hx. she has an upcoming appointment with him in a few months. ? ?Only issue she has is that she sometimes urinate more at nights.  She gets up about twice during the night to urinate.  Drinks a lot of water during the day just out of habit; not out of increased thirst.  Does not drink a lot of water late at nights.  She usually gets in bed around 11 PM. ? ?She is overweight for height based on her BMI.  She is very active.  She walks 1 to 3 miles a day on her break at work.  She also makes an effort to park further away from buildings and to take the stairs and buildings that have more than 1 floors. ?Does well with her eating habits.  She incorporates fruits and vegetables into her diet daily.  She does not eat pork and very limited red meat.  She eats more chicken, [place] and seafood. ? ?HM: Does not need a Pap as she has had a hysterectomy for fibroids.  Reports her last colonoscopy was 07/20/2021 by Dr. Collene Mares.  She will sign a release for [place] to get that report.  Due for shingles vaccine which she has never had.  She would like to get it but not today.  She is agreeable to scheduling an appointment with our clinical pharmacist.  Due for tetanus vaccine but also wants to hold on that. ? ?Current Outpatient Medications on File Prior to Visit  ?Medication Sig Dispense Refill  ? Ascorbic Acid (VITAMIN C) 1000  MG tablet Take 1,000 mg by mouth daily.    ? Multiple Vitamins-Minerals (WOMENS MULTIVITAMIN PO) Take 1 tablet by mouth daily.    ? ?No current facility-administered medications on file prior to visit.  ? ? ?Allergies  ?Allergen Reactions  ? Shrimp [Shellfish Allergy] Hives  ? ? ?Social History  ? ?Socioeconomic History  ? Marital status: Divorced  ?  Spouse name: Not on file  ? Number of children: 2  ? Years of education: Not on file  ? Highest education level: Not on file  ?Occupational History  ? Occupation: Printmaker for [employer]  ?Tobacco Use  ? Smoking status: Never  ? Smokeless tobacco: Never  ?Vaping Use  ? Vaping Use: Never used  ?Substance and Sexual Activity  ? Alcohol use: Yes  ?  Comment: occ  ? Drug use: Not Currently  ? Sexual activity: Not on file  ?Other Topics Concern  ? Not on file  ?Social History Narrative  ? Not on file  ? ?Social Determinants of Health  ? ?Financial Resource Strain: Not on file  ?Food Insecurity: Not on file  ?Transportation Needs: Not on file  ?Physical Activity: Not on file  ?Stress: Not on file  ?Social  Connections: Not on file  ?Intimate Partner Violence: Not on file  ? ? ?Family History  ?Problem Relation Age of Onset  ? Heart attack Mother   ? Diabetes Father   ? ? ?Past Surgical History:  ?Procedure Laterality Date  ? ABDOMINAL HYSTERECTOMY    ? CESAREAN SECTION    ? HYSTERECTOMY ABDOMINAL WITH SALPINGECTOMY    ? ? ?ROS: ?Review of Systems  ?Respiratory:  Negative for cough and shortness of breath.   ?Cardiovascular:  Negative for chest pain and palpitations.  ? ?PHYSICAL EXAM: ?BP 122/78   Pulse 73   Resp 16   Ht 5\' 5"  (1.651 m)   Wt 180 lb (81.6 kg)   SpO2 99%   BMI 29.95 kg/m?   ?Physical Exam ? ?General appearance - alert, well appearing, middle-age African-American female and in no distress ?Mental status - normal mood, behavior, speech, dress, motor activity, and thought processes ?Eyes - pupils equal and reactive, extraocular eye  movements intact ?Nose - normal and patent, no erythema, discharge or polyps ?Mouth - mucous membranes moist, pharynx normal without lesions ?Neck - supple, no significant adenopathy ?Chest - clear to auscultation, no wheezes, rales or rhonchi, symmetric air entry ?Heart - normal rate, regular rhythm, normal S1, S2, no murmurs, rubs, clicks or gallops ?Extremities - peripheral pulses normal, no pedal edema, no clubbing or cyanosis ? ? ?   ? View : No data to display.  ?  ?  ?  ? ?Lipid Panel  ?   ?Component Value Date/Time  ? CHOL 224 (H) 04/12/2021 0825  ? TRIG 81 04/12/2021 0825  ? HDL 56 04/12/2021 0825  ? LDLCALC 154 (H) 04/12/2021 0825  ? LDLDIRECT 154 (H) 04/12/2021 3254  ? ? ? ?ASSESSMENT AND PLAN: ?1. Establishing care with new doctor, encounter for ? ? ?2. Overweight (BMI 25.0-29.9) ?Commended her on healthy eating habits.  Further dietary counseling given.  Encouraged her to cut back on portion sizes of white carbohydrates, continue to incorporate fresh fruits and vegetables into the diet daily with goal of getting in about 3 servings of fresh fruits a day.  Commended her on regular exercise and encouraged her to keep up the good works. ?- CBC ?- Comprehensive metabolic panel ?- Lipid panel ? ?3. Nocturia ?-Advised that she avoids drinking fluids past 8 or 9 PM if she is getting in bed at 11 PM. ? ?4. Need for Tdap vaccination ?Patient deferred getting this done until next visit ? ?5. Encounter for screening mammogram for malignant neoplasm of breast ?- MM Digital Screening; Future ? ?She will sign a release for [place] to get colonoscopy report from Dr. Loreta Ave.  She will see our clinical pharmacist in 1 month for shingles vaccine.  Informed her that the shingles vaccine can cause a lot of soreness and redness at the injection site that can sometimes last several days. ? ?Patient was given the opportunity to ask questions.  Patient verbalized understanding of the plan and was able to repeat key elements of the  plan.  ? ?This documentation was completed using Paediatric nurse.  Any transcriptional errors are unintentional. ? ?Orders Placed This Encounter  ?Procedures  ? MM Digital Screening  ? CBC  ? Comprehensive metabolic panel  ? Lipid panel  ? ? ? ?Requested Prescriptions  ? ? No prescriptions requested or ordered in this encounter  ? ? ?Return in about 6 months (around 06/16/2022) for Sign release to get colonoscopy report from Dr. Loreta Ave.  Appt with Lurena Joiner in 1 mth for Shingrix. ? ?Karle Plumber, MD, FACP ?

## 2021-12-16 LAB — COMPREHENSIVE METABOLIC PANEL
ALT: 14 IU/L (ref 0–32)
AST: 17 IU/L (ref 0–40)
Albumin/Globulin Ratio: 1.7 (ref 1.2–2.2)
Albumin: 4.3 g/dL (ref 3.8–4.9)
Alkaline Phosphatase: 49 IU/L (ref 44–121)
BUN/Creatinine Ratio: 22 (ref 9–23)
BUN: 17 mg/dL (ref 6–24)
Bilirubin Total: 0.2 mg/dL (ref 0.0–1.2)
CO2: 23 mmol/L (ref 20–29)
Calcium: 9.6 mg/dL (ref 8.7–10.2)
Chloride: 107 mmol/L — ABNORMAL HIGH (ref 96–106)
Creatinine, Ser: 0.76 mg/dL (ref 0.57–1.00)
Globulin, Total: 2.6 g/dL (ref 1.5–4.5)
Glucose: 92 mg/dL (ref 70–99)
Potassium: 4.1 mmol/L (ref 3.5–5.2)
Sodium: 144 mmol/L (ref 134–144)
Total Protein: 6.9 g/dL (ref 6.0–8.5)
eGFR: 94 mL/min/{1.73_m2} (ref >59)

## 2021-12-16 LAB — CBC
Hematocrit: 39.2 % (ref 34.0–46.6)
Hemoglobin: 12.9 g/dL (ref 11.1–15.9)
MCH: 27.2 pg (ref 26.6–33.0)
MCHC: 32.9 g/dL (ref 31.5–35.7)
MCV: 83 fL (ref 79–97)
Platelets: 307 10*3/uL (ref 150–450)
RBC: 4.75 x10E6/uL (ref 3.77–5.28)
RDW: 12.6 % (ref 11.7–15.4)
WBC: 3.3 10*3/uL — ABNORMAL LOW (ref 3.4–10.8)

## 2021-12-16 LAB — LIPID PANEL
Chol/HDL Ratio: 3.5 ratio (ref 0.0–4.4)
Cholesterol, Total: 208 mg/dL — ABNORMAL HIGH (ref 100–199)
HDL: 60 mg/dL (ref >39)
LDL Chol Calc (NIH): 139 mg/dL — ABNORMAL HIGH (ref 0–99)
Triglycerides: 51 mg/dL (ref 0–149)
VLDL Cholesterol Cal: 9 mg/dL (ref 5–40)

## 2022-01-02 ENCOUNTER — Ambulatory Visit
Admission: RE | Admit: 2022-01-02 | Discharge: 2022-01-02 | Disposition: A | Discharge status: Home / Self Care | Payer: 59 | Source: Ambulatory Visit | Attending: Internal Medicine | Admitting: Internal Medicine

## 2022-01-02 DIAGNOSIS — Z1231 Encounter for screening mammogram for malignant neoplasm of breast: Secondary | ICD-10-CM

## 2022-01-09 ENCOUNTER — Encounter: Payer: Self-pay | Admitting: Cardiology

## 2022-01-09 ENCOUNTER — Ambulatory Visit: Payer: 59 | Admitting: Cardiology

## 2022-01-09 VITALS — BP 111/64 | HR 75 | Temp 97.2°F | Resp 16 | Ht 65.0 in | Wt 182.0 lb

## 2022-01-09 DIAGNOSIS — Z8249 Family history of ischemic heart disease and other diseases of the circulatory system: Secondary | ICD-10-CM

## 2022-01-09 DIAGNOSIS — E6609 Other obesity due to excess calories: Secondary | ICD-10-CM

## 2022-01-09 DIAGNOSIS — R072 Precordial pain: Secondary | ICD-10-CM

## 2022-01-09 NOTE — Progress Notes (Signed)
? ?Date:  01/09/2022  ? ?ID:  Jo Williamson, DOB Jul 08, 1971, MRN [id number] ? ?PCP:  Ladell Pier, MD  ?Cardiologist:  Rex Kras, DO, [facility] (established care 03/30/2020) ? ?Date: 01/09/22 ?Last Office Visit: April 18, 2021 ? ?Chief Complaint  ?Patient presents with  ? Hyperlipidemia  ? Follow-up  ? ? ?HPI  ?Jo Williamson is a 51 y.o. female whose past medical history and cardiovascular risk factors include: Family history of premature coronary artery disease, obesity due to excess calories. ? ?Referred to cardiology for evaluation of chest pain and left arm pain.  Underwent ischemic work-up including an echocardiogram, coronary calcium score, and exercise treadmill stress test.  Results were reviewed as part of today's office visit and noted below for further reference. ? ?She presents for 65-monthfollow-up visit.  She is doing well from a cardiovascular standpoint.  She is increased her physical activity and trying to implement lifestyle changes to improve her lipids and risk factors given her family history of premature CAD.  Most recent labs from April 2023 independently reviewed and noted below for further reference. ? ?Since last office visit she denies any chest pain or left arm pain.  No exertional chest pain or shortness of breath.  No hospitalizations or urgent care visits. ? ?Mom passed away from MI at age of 641back in 2011.  ? ?FUNCTIONAL STATUS: Walks atleast 3 miles a day. ? ?ALLERGIES: ?Allergies  ?Allergen Reactions  ? Shrimp [Shellfish Allergy] Hives  ? ? ?MEDICATION LIST PRIOR TO VISIT: ?Current Meds  ?Medication Sig  ? Ascorbic Acid (VITAMIN C) 1000 MG tablet Take 1,000 mg by mouth daily.  ? Multiple Vitamins-Minerals (WOMENS MULTIVITAMIN PO) Take 1 tablet by mouth daily.  ?  ? ?PAST MEDICAL HISTORY: ?History reviewed. No pertinent past medical history. ? ? ?PAST SURGICAL HISTORY: ?Past Surgical History:  ?Procedure Laterality Date  ? ABDOMINAL HYSTERECTOMY    ? CESAREAN SECTION    ?  HYSTERECTOMY ABDOMINAL WITH SALPINGECTOMY    ? ? ?FAMILY HISTORY: ?The patient family history includes Diabetes in her father; Heart attack in her mother. ? ?SOCIAL HISTORY:  ?The patient  reports that she has never smoked. She has never used smokeless tobacco. She reports current alcohol use. She reports that she does not currently use drugs. ? ?REVIEW OF SYSTEMS: ?Review of Systems  ?Constitutional: Negative for chills and fever.  ?HENT:  Negative for hoarse voice and nosebleeds.   ?Eyes:  Negative for discharge, double vision and pain.  ?Cardiovascular:  Negative for chest pain, claudication, dyspnea on exertion, leg swelling, near-syncope, orthopnea, palpitations, paroxysmal nocturnal dyspnea and syncope.  ?Respiratory:  Negative for hemoptysis and shortness of breath.   ?Musculoskeletal:  Negative for muscle cramps and myalgias.  ?Gastrointestinal:  Negative for abdominal pain, constipation, diarrhea, hematemesis, hematochezia, melena, nausea and vomiting.  ?Neurological:  Negative for dizziness and light-headedness.  ? ?PHYSICAL EXAM: ? ?  01/09/2022  ? 10:02 AM 12/15/2021  ?  8:52 AM 04/18/2021  ? 10:02 AM  ?Vitals with BMI  ?Height '5\' 5"'  '5\' 5"'  '5\' 4"'   ?Weight 182 lbs 180 lbs 181 lbs  ?BMI 30.29 29.95 31.05  ?Systolic [id number] ?Diastolic 64 78 54  ?Pulse 75 73 70  ? ?CONSTITUTIONAL: Well-developed and well-nourished. No acute distress.  ?SKIN: Skin is warm and dry. No rash noted. No cyanosis. No pallor. No jaundice ?HEAD: Normocephalic and atraumatic.  ?EYES: No scleral icterus ?MOUTH/THROAT: Moist oral membranes.  ?NECK: No JVD present. No  thyromegaly noted. No carotid bruits  ?LYMPHATIC: No visible cervical adenopathy.  ?CHEST Normal respiratory effort. No intercostal retractions  ?LUNGS: Clear to auscultation bilaterally. No stridor. No wheezes. No rales.  ?CARDIOVASCULAR: Regular rate and rhythm, positive S1-S2, no murmurs rubs or gallops appreciated. ?ABDOMINAL: Soft, nontender, nondistended, positive bowel  sounds in all 4 quadrants, no apparent ascites.  ?EXTREMITIES: 2+ bilateral DP/PT pulses, no peripheral edema  ?HEMATOLOGIC: No significant bruising ?NEUROLOGIC: Oriented to person, place, and time. Nonfocal. Normal muscle tone.  ?PSYCHIATRIC: Normal mood and affect. Normal behavior. Cooperative ? ?CARDIAC DATABASE: ?EKG: ?01/09/2022: Normal sinus rhythm, 64 bpm, without underlying ischemia injury pattern. ? ?Echocardiogram: ?04/05/2020: LVEF 60-65%, LV cavity normal in size, mild LVH, normal wall motion, no significant valvular abnormality. ? ?Stress Testing: ?Exercise treadmill stress test 04/05/2020:  ?Exercise treadmill stress test performed using Bruce protocol.  Patient reached 7.4 METS, and 76% of age predicted maximum heart rate.  Exercise capacity was low.  No chest pain reported. Attenuated heart rate and blood pressure response to exercise.  ?Stress EKG with submaximal exercise revealed no ischemic changes.  ?Inconclusive study. Consider alternate diagnostic tests, if clinically indicated.  ? ?Coronary artery calcification scoring performed on April 01, 2020 at [facility]: ?Total calcium score:0 AU.  ?Impression: No identifiable calcified coronary artery plaque. Very low, generally less than 5% risk of coronary artery disease.  ? ?Heart Catheterization: ?None ? ?LABORATORY DATA: ?External Labs: ?Collected: 03/04/2020 ?Creatinine 0.66 mg/dL. ?eGFR: 119 mL/min per 1.73 m? ?Lipid profile: Total cholesterol 233, triglycerides 67, HDL 58, LDL 159, non-HDL 175 ?TSH: 3.02 ? ?Lab Results  ?Component Value Date  ? CHOL 208 (H) 12/15/2021  ? HDL 60 12/15/2021  ? LDLCALC 139 (H) 12/15/2021  ? LDLDIRECT 154 (H) 04/12/2021  ? TRIG 51 12/15/2021  ? CHOLHDL 3.5 12/15/2021  ? ? ? ?IMPRESSION: ? ?  ICD-10-CM   ?1. Precordial pain  R07.2   ?  ?2. Family history of premature CAD  Z82.49 EKG 12-Lead  ?  ?3. Class 1 obesity due to excess calories with serious comorbidity and body mass index (BMI) of 30.0 to 30.9 in adult  E66.09    ? Z68.30   ?  ?  ? ?RECOMMENDATIONS: ?Jo Williamson is a 51 y.o. female whose past medical history and cardiac risk factors include: Family history of premature coronary artery disease, obesity due to excess calories. ? ?Patient presents today for 1-monthfollow-up for reevaluation of precordial chest pain and left arm pain.  She was referred to the practice for chest pain and arm pain and underwent an echocardiogram, coronary calcium score, and exercise treadmill stress test. ? ?LVEF is preserved, total coronary calcium score is 0, and exercise treadmill stress test was submaximal as target heart rate was not achieved.  Based on the workload performed stress ECG was negative for ischemia.  Due to submaximal stress test she was recommended to follow-up in 6 months for reevaluation. ? ?Over the last 6 months she has been asymptomatic from chest pain or left arm pain.  She is increased her physical activity and has lost weight, HDL has improved marginally and LDL has reduced marginally.  She feels great and denies any cardiovascular discomfort. ? ?Patient is LDL is higher than 39 mg/dL and her estimated 10-year risk of ASCVD is 1.3%.  This shared decision was to continue with lifestyle changes and to have it reevaluated after her next lab work. ? ?Since I am not actively managing her any comorbid conditions I recommended  as needed visits.  However, patient feels comfortable following up on annual basis.  I recommended that she is more than welcome to follow-up annually would recommend it after her yearly visit so up-to-date labs are available for review. ? ?EKG ordered and independently reviewed, outside labs from April 2023 independently reviewed, prior ischemic work-up results reviewed as part of medical decision making. ? ?FINAL MEDICATION LIST END OF ENCOUNTER: ?No orders of the defined types were placed in this encounter. ?  ?There are no discontinued medications. ?  ? ?Current Outpatient Medications:  ?   Ascorbic Acid (VITAMIN C) 1000 MG tablet, Take 1,000 mg by mouth daily., Disp: , Rfl:  ?  Multiple Vitamins-Minerals (WOMENS MULTIVITAMIN PO), Take 1 tablet by mouth daily., Disp: , Rfl:  ? ?Orders Placed Th

## 2022-01-16 ENCOUNTER — Ambulatory Visit: Payer: 59 | Attending: Internal Medicine | Admitting: Pharmacist

## 2022-01-16 DIAGNOSIS — Z23 Encounter for immunization: Secondary | ICD-10-CM

## 2022-01-16 NOTE — Progress Notes (Signed)
Patient presents for vaccination against zoster per orders of Dr. Johnson. Consent given. Counseling provided. No contraindications exists. Vaccine administered without incident.   Luke Van Ausdall, PharmD, BCACP, CPP Clinical Pharmacist [facility] [phone]  

## 2022-06-19 ENCOUNTER — Ambulatory Visit: Payer: 59 | Attending: Internal Medicine | Admitting: Internal Medicine

## 2022-06-19 ENCOUNTER — Encounter: Payer: Self-pay | Admitting: Internal Medicine

## 2022-06-19 VITALS — BP 115/80 | HR 71 | Ht 65.0 in | Wt 189.2 lb

## 2022-06-19 DIAGNOSIS — E669 Obesity, unspecified: Secondary | ICD-10-CM

## 2022-06-19 DIAGNOSIS — R635 Abnormal weight gain: Secondary | ICD-10-CM | POA: Diagnosis not present

## 2022-06-19 DIAGNOSIS — D72819 Decreased white blood cell count, unspecified: Secondary | ICD-10-CM | POA: Diagnosis not present

## 2022-06-19 DIAGNOSIS — Z23 Encounter for immunization: Secondary | ICD-10-CM

## 2022-06-19 DIAGNOSIS — E66811 Obesity, class 1: Secondary | ICD-10-CM

## 2022-06-19 DIAGNOSIS — Z6831 Body mass index (BMI) 31.0-31.9, adult: Secondary | ICD-10-CM

## 2022-06-19 DIAGNOSIS — E782 Mixed hyperlipidemia: Secondary | ICD-10-CM

## 2022-06-19 DIAGNOSIS — Z2821 Immunization not carried out because of patient refusal: Secondary | ICD-10-CM

## 2022-06-19 NOTE — Progress Notes (Signed)
Patient ID: KEMONIE CUTILLO, female    DOB: June 30, 1971  MRN: [id number]  CC: Chronic disease management  Subjective: Jo Williamson is a 51 y.o. female who presents for chronic disease management Her concerns today include:  Fhx of premature CAD (followed by Dr. Terri Skains), over wgh, HL  She is concern that she is gaining wgh and has a difficult time losing it. She feels she is doing good with eating habits - grill chicken with salad for lunch, oatmeal with blueberry for BF, light dinner. Drink mainly water, hot tea.   Rides stationary 3-4 times a wk.  She also walk at work between meetings.   Went over labs with her from last visit.  White blood cell count was slightly low at 3.3. Cholesterol level was still elevated but had improved.  HM:  declines flu shot.  Agree for 2nd shingles and Tdapt.    Patient Active Problem List   Diagnosis Date Noted   Overweight (BMI 25.0-29.9) 12/15/2021     Current Outpatient Medications on File Prior to Visit  Medication Sig Dispense Refill   Ascorbic Acid (VITAMIN C) 1000 MG tablet Take 1,000 mg by mouth daily.     Multiple Vitamins-Minerals (WOMENS MULTIVITAMIN PO) Take 1 tablet by mouth daily.     No current facility-administered medications on file prior to visit.    Allergies  Allergen Reactions   Shrimp [Shellfish Allergy] Hives    Social History   Socioeconomic History   Marital status: Divorced    Spouse name: Not on file   Number of children: 2   Years of education: Not on file   Highest education level: Not on file  Occupational History   Occupation: Printmaker for [employer]  Tobacco Use   Smoking status: Never   Smokeless tobacco: Never  Vaping Use   Vaping Use: Never used  Substance and Sexual Activity   Alcohol use: Yes    Comment: occ   Drug use: Not Currently   Sexual activity: Not on file  Other Topics Concern   Not on file  Social History Narrative   Not on file   Social  Determinants of Health   Financial Resource Strain: Not on file  Food Insecurity: Not on file  Transportation Needs: Not on file  Physical Activity: Not on file  Stress: Not on file  Social Connections: Not on file  Intimate Partner Violence: Not on file    Family History  Problem Relation Age of Onset   Heart attack Mother    Diabetes Father     Past Surgical History:  Procedure Laterality Date   ABDOMINAL HYSTERECTOMY     CESAREAN SECTION     HYSTERECTOMY ABDOMINAL WITH SALPINGECTOMY      ROS: Review of Systems Negative except as stated above  PHYSICAL EXAM: BP 115/80   Pulse 71   Ht 5\' 5"  (1.651 m)   Wt 189 lb 3.2 oz (85.8 kg)   SpO2 98%   BMI 31.48 kg/m   Wt Readings from Last 3 Encounters:  06/19/22 189 lb 3.2 oz (85.8 kg)  01/09/22 182 lb (82.6 kg)  12/15/21 180 lb (81.6 kg)    Physical Exam  General appearance - alert, well appearing, middle-age African-American female and in no distress Mental status - normal mood, behavior, speech, dress, motor activity, and thought processes Neck - supple, no significant adenopathy Chest - clear to auscultation, no wheezes, rales or rhonchi, symmetric air entry Heart - normal rate,  regular rhythm, normal S1, S2, no murmurs, rubs, clicks or gallops Extremities - peripheral pulses normal, no pedal edema, no clubbing or cyanosis      Latest Ref Rng & Units 12/15/2021    9:35 AM  CMP  Glucose 70 - 99 mg/dL 92   BUN 6 - 24 mg/dL 17   Creatinine 3.71 - 1.00 mg/dL 6.96   Sodium 789 - 381 mmol/L 144   Potassium 3.5 - 5.2 mmol/L 4.1   Chloride 96 - 106 mmol/L 107   CO2 20 - 29 mmol/L 23   Calcium 8.7 - 10.2 mg/dL 9.6   Total Protein 6.0 - 8.5 g/dL 6.9   Total Bilirubin 0.0 - 1.2 mg/dL 0.2   Alkaline Phos 44 - 121 IU/L 49   AST 0 - 40 IU/L 17   ALT 0 - 32 IU/L 14    Lipid Panel     Component Value Date/Time   CHOL 208 (H) 12/15/2021 0935   TRIG 51 12/15/2021 0935   HDL 60 12/15/2021 0935   CHOLHDL 3.5  12/15/2021 0935   LDLCALC 139 (H) 12/15/2021 0935   LDLDIRECT 154 (H) 04/12/2021 0826    CBC    Component Value Date/Time   WBC 3.3 (L) 12/15/2021 0935   RBC 4.75 12/15/2021 0935   HGB 12.9 12/15/2021 0935   HCT 39.2 12/15/2021 0935   PLT 307 12/15/2021 0935   MCV 83 12/15/2021 0935   MCH 27.2 12/15/2021 0935   MCHC 32.9 12/15/2021 0935   RDW 12.6 12/15/2021 0935    ASSESSMENT AND PLAN:  1. Unexplained weight gain Commended her on trying to eat healthy and encouraged her to continue regular exercise.  She is discouraged about the weight gain because she feels she is doing everything that she is supposed to do.  We discussed referring her to nutritionist for sitdown one-on-one counseling to see if this would help.  She is agreeable to doing this.  We will also check a thyroid level to make sure no low functioning thyroid.  2. Obesity (BMI 30.0-34.9) See #1 above.  Printed information given on healthy eating habits. - Hemoglobin A1c - TSH+T4F+T3Free - Amb ref to Medical Nutrition Therapy-MNT  3. Mixed hyperlipidemia Went over her last lipid profile with her that was checked in April of this year.  LDL had improved from 154 to 139.  4. Leukopenia, unspecified type Recheck CBC today with differential. - CBC With Diff/Platelet  5. Need for zoster vaccination Given second Shingrix vaccine today. She will schedule to see our clinical pharmacist in 2 weeks to receive the Tdap vaccine.  6. Influenza vaccination declined  Patient was given the opportunity to ask questions.  Patient verbalized understanding of the plan and was able to repeat key elements of the plan.   This documentation was completed using Paediatric nurse.  Any transcriptional errors are unintentional.  Orders Placed This Encounter  Procedures   Hemoglobin A1c   CBC With Diff/Platelet   TSH+T4F+T3Free   Amb ref to Medical Nutrition Therapy-MNT     Requested Prescriptions    No  prescriptions requested or ordered in this encounter    Return in about 5 months (around 11/18/2022) for Appt with [facility] in 2 wks for Tdapt vaccine.  Jonah Blue, MD, FACP

## 2022-06-19 NOTE — Patient Instructions (Signed)

## 2022-06-20 LAB — HEMOGLOBIN A1C
Est. average glucose Bld gHb Est-mCnc: 131 mg/dL
Hgb A1c MFr Bld: 6.2 % — ABNORMAL HIGH (ref 4.8–5.6)

## 2022-06-20 LAB — TSH+T4F+T3FREE
Free T4: 0.87 ng/dL (ref 0.82–1.77)
T3, Free: 3 pg/mL (ref 2.0–4.4)
TSH: 3.63 u[IU]/mL (ref 0.450–4.500)

## 2022-06-20 LAB — CBC WITH DIFF/PLATELET
Basophils Absolute: 0 10*3/uL (ref 0.0–0.2)
Basos: 1 %
EOS (ABSOLUTE): 0.1 10*3/uL (ref 0.0–0.4)
Eos: 3 %
Hematocrit: 40.5 % (ref 34.0–46.6)
Hemoglobin: 13.2 g/dL (ref 11.1–15.9)
Immature Grans (Abs): 0 10*3/uL (ref 0.0–0.1)
Immature Granulocytes: 0 %
Lymphocytes Absolute: 1.5 10*3/uL (ref 0.7–3.1)
Lymphs: 40 %
MCH: 27.3 pg (ref 26.6–33.0)
MCHC: 32.6 g/dL (ref 31.5–35.7)
MCV: 84 fL (ref 79–97)
Monocytes Absolute: 0.3 10*3/uL (ref 0.1–0.9)
Monocytes: 8 %
Neutrophils Absolute: 1.7 10*3/uL (ref 1.4–7.0)
Neutrophils: 48 %
Platelets: 328 10*3/uL (ref 150–450)
RBC: 4.83 x10E6/uL (ref 3.77–5.28)
RDW: 13.1 % (ref 11.7–15.4)
WBC: 3.6 10*3/uL (ref 3.4–10.8)

## 2022-07-03 ENCOUNTER — Ambulatory Visit: Payer: 59

## 2022-07-06 ENCOUNTER — Ambulatory Visit: Payer: 59

## 2022-07-11 ENCOUNTER — Encounter: Payer: Self-pay | Admitting: Dietician

## 2022-07-11 ENCOUNTER — Encounter: Payer: 59 | Attending: Internal Medicine | Admitting: Dietician

## 2022-07-11 DIAGNOSIS — Z713 Dietary counseling and surveillance: Secondary | ICD-10-CM | POA: Diagnosis not present

## 2022-07-11 DIAGNOSIS — E669 Obesity, unspecified: Secondary | ICD-10-CM | POA: Insufficient documentation

## 2022-07-11 DIAGNOSIS — Z6830 Body mass index (BMI) 30.0-30.9, adult: Secondary | ICD-10-CM | POA: Diagnosis not present

## 2022-07-11 DIAGNOSIS — E663 Overweight: Secondary | ICD-10-CM

## 2022-07-11 NOTE — Progress Notes (Signed)
Medical Nutrition Therapy  Appointment Start time:  [phone]  Appointment End time:  [phone]  Primary concerns today: Pt states she was referred by her doctor with concern of prediabetes and wants to know what she can work on as far as nutrition and lifestyle goes.    Referral diagnosis: E66.9 Preferred learning style: no preference indicated Learning readiness: ready   NUTRITION ASSESSMENT   Anthropometrics  Ht: 62in Wt: not assessed  Clinical Medical Hx: HLD Medications: none Labs: 12/15/21: chol 208, LDL 139. 06/19/22: A1c 6.2% Notable Signs/Symptoms: none Food Allergies: shellfish- shrimp only  Lifestyle & Dietary Hx  Pt lives with daughter and son. Pt does the grocery shopping and cooking. Pt eats at home more often than going out and packs lunch for work.   Pt states she tries to eat healthy and exercise but wants to make sure she is doing everything she can to prevent diabetes and prevent going on medication.   Pt states she does not eat pork and limits her beef intake.   Pt used to walk a mile after lunch but hasn't been able to walk as much. Pt has control over her schedule and used to walk 2-3 times at work. Pt states she wants to start walking after lunch again.   Pt has a stationary bike. Pt stretches for 5-10 minutes, and bikes for 15 minutes in the evenings.    Supplements: vitamin C, hair skin and nails Sleep: 6-6.5 hours, pt feels rested Stress / self-care: mild stress  Current average weekly physical activity: stretches, stationary bike, walk at work. Pt averaging 105 minutes per week.   24-Hr Dietary Recall First Meal: oatmeal with fruit or plain oatmeal Snack: nuts OR none Second Meal: most often packs leftovers: crab cake with broccoli and green beans OR occasionally goes out (panera salad) Snack: none OR nuts Third Meal: alfredo lasagna and side salad with ground [place] and chicken sausage Snack: none OR occasional dessert  Beverages: hot tea, water,  occasional ginger-ale (1x/wk).    NUTRITION DIAGNOSIS  NB-1.1 Food and nutrition-related knowledge deficit As related to prediabetes.  As evidenced by pt report and diet hx.   NUTRITION INTERVENTION  Nutrition education (E-1) on the following topics:  Prediabetes A1c and goals Insulin resistance  LDL-lowering nutrition therapy Benefits of physical activity on blood glucose control MyPlate Building balanced snacks Balance of carbohydrate, protein, fruits and vegetables Importance of adequate hydration and sleep Impact of stress on blood glucose Saturated vs unsaturated fat  Handouts Provided Include  Balanced plate food groups Dish up a healthy meal American heart association: guide to better sleep  Learning Style & Readiness for Change Teaching method utilized: Visual & Auditory  Demonstrated degree of understanding via: Teach Back  Barriers to learning/adherence to lifestyle change: none  Goals Established by Pt Aim for 150 minutes of physical activity weekly. -get back to your mile at work after lunch -continue biking after dinner  Eat more Non-Starchy Vegetables - try to make 1/2 of your plate non-starchy vegetables  Minimize added sugars and refined grains.  Choose whole foods over processed.  Make simple meals at home more often than eating out.  Aim to eat within 1-2 hours of waking up and every 3-5 hours following.    MONITORING & EVALUATION Dietary intake, weekly physical activity, and follow up in 5 months.  Next Steps  Patient is to call for questions.

## 2022-07-11 NOTE — Patient Instructions (Addendum)
Aim for 150 minutes of physical activity weekly. -get back to your mile at work after lunch -continue biking after dinner  Eat more Non-Starchy Vegetables - try to make 1/2 of your plate non-starchy vegetables  Minimize added sugars and refined grains.  Choose whole foods over processed.  Make simple meals at home more often than eating out.  Aim to eat within 1-2 hours of waking up and every 3-5 hours following.

## 2022-07-20 ENCOUNTER — Ambulatory Visit: Payer: 59 | Attending: Internal Medicine

## 2022-07-20 DIAGNOSIS — Z23 Encounter for immunization: Secondary | ICD-10-CM

## 2022-09-28 IMAGING — MG MM DIGITAL SCREENING BILAT W/ TOMO AND CAD
8 series · 8 of 24 positions shown · non-contrast
Comparison: Previous exam(s).

CLINICAL DATA: Screening.

EXAM:
DIGITAL SCREENING BILATERAL MAMMOGRAM WITH TOMOSYNTHESIS AND CAD
TECHNIQUE: Bilateral screening digital craniocaudal and mediolateral oblique
mammograms were obtained. Bilateral screening digital breast
tomosynthesis was performed. The images were evaluated with
computer-aided detection.

[L MLO synth-2D]
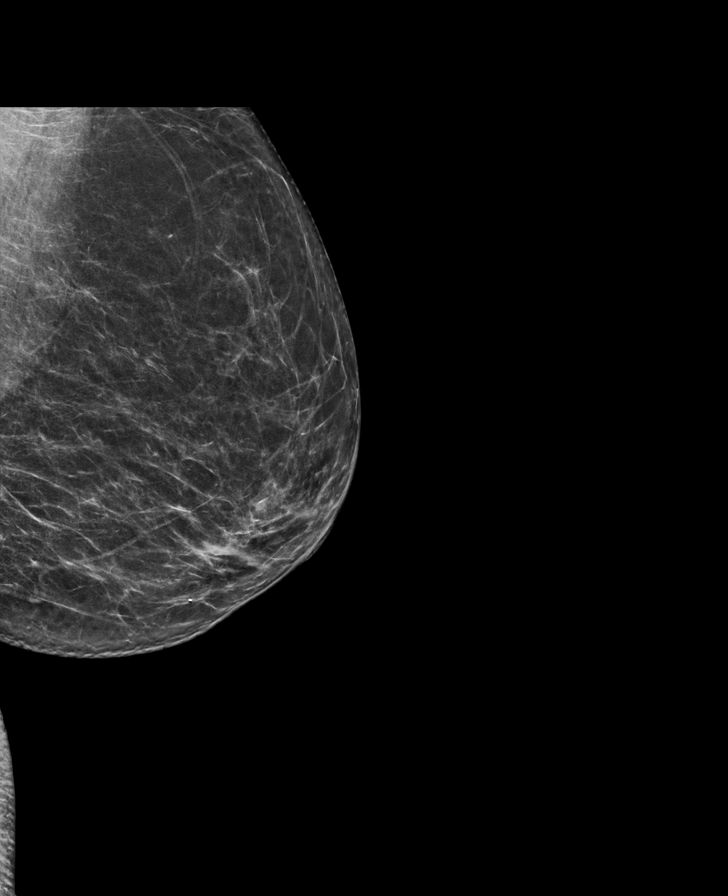

[R MLO synth-2D]
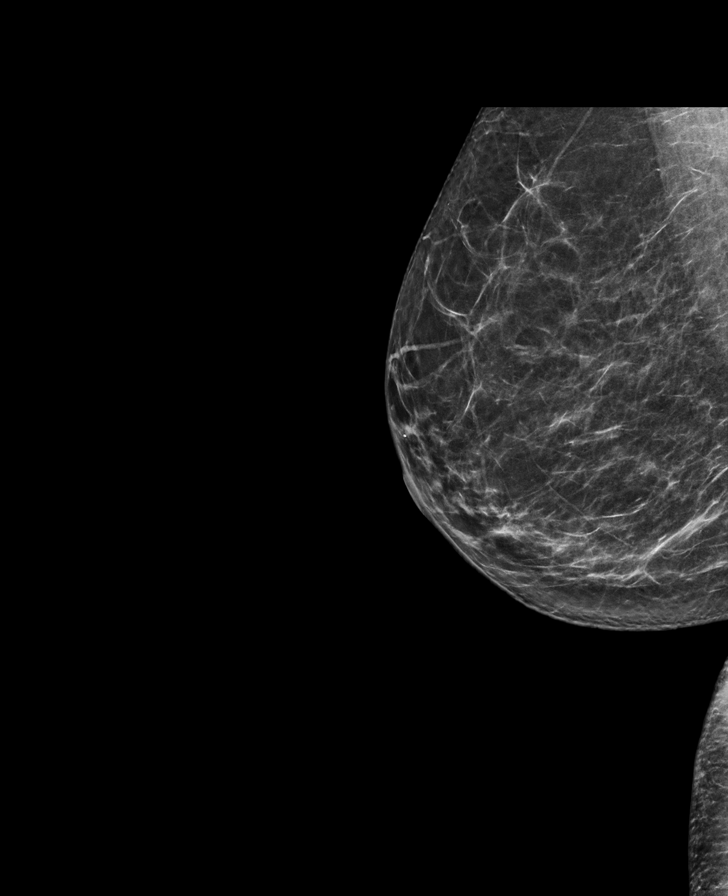

[R CC synth-2D]
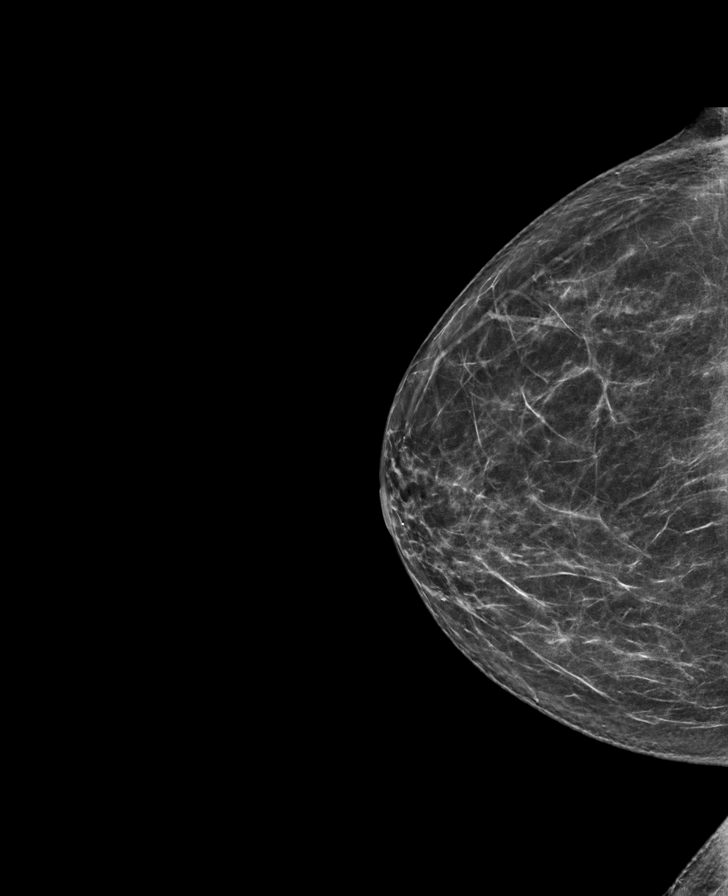

[L CC synth-2D]
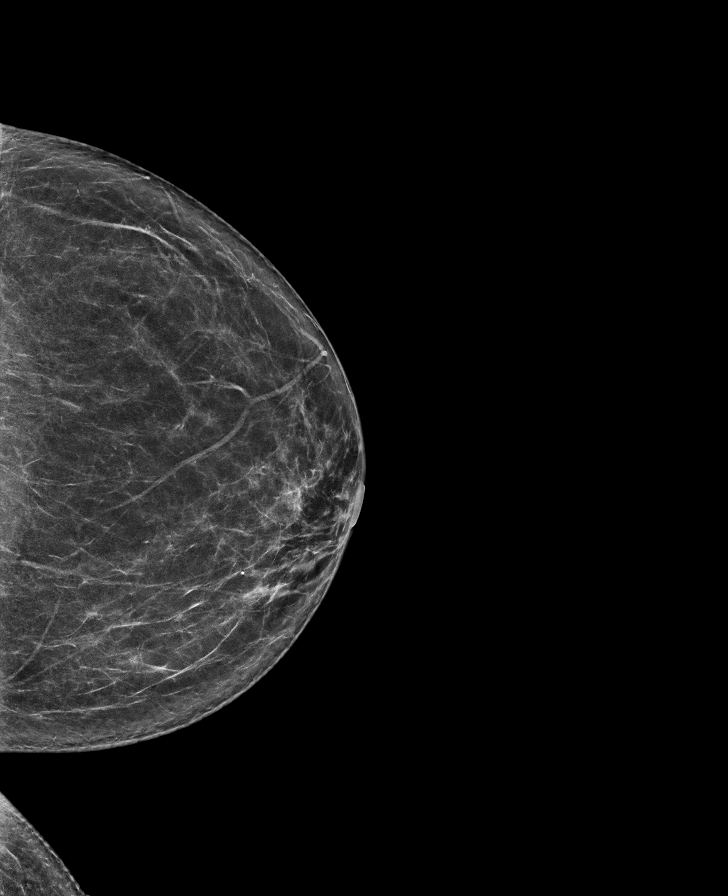

[L CC tomo · tomo slice 37/74.0]
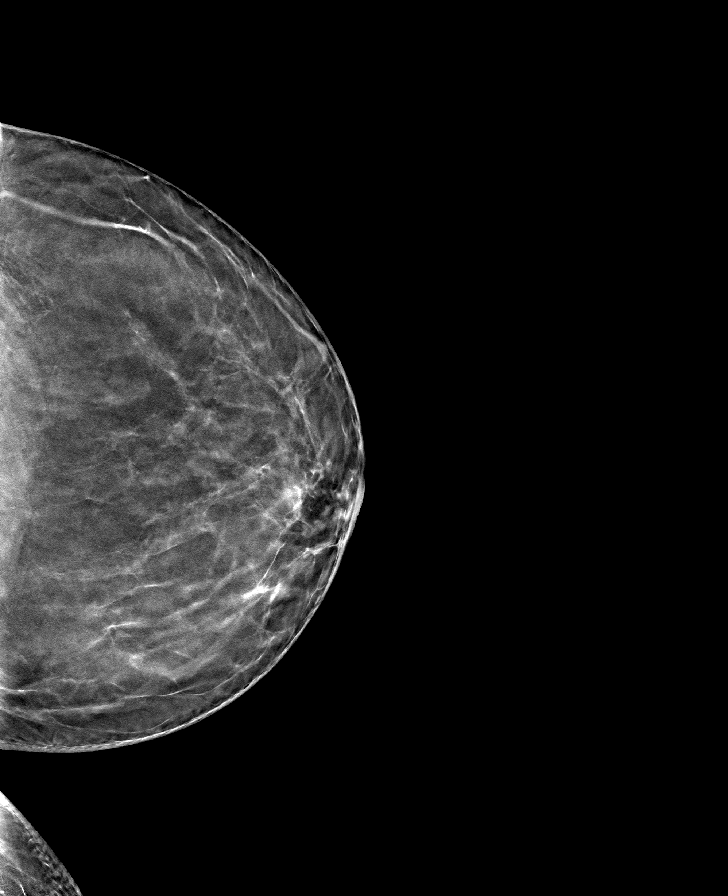

[L MLO tomo · tomo slice 37/73.0]
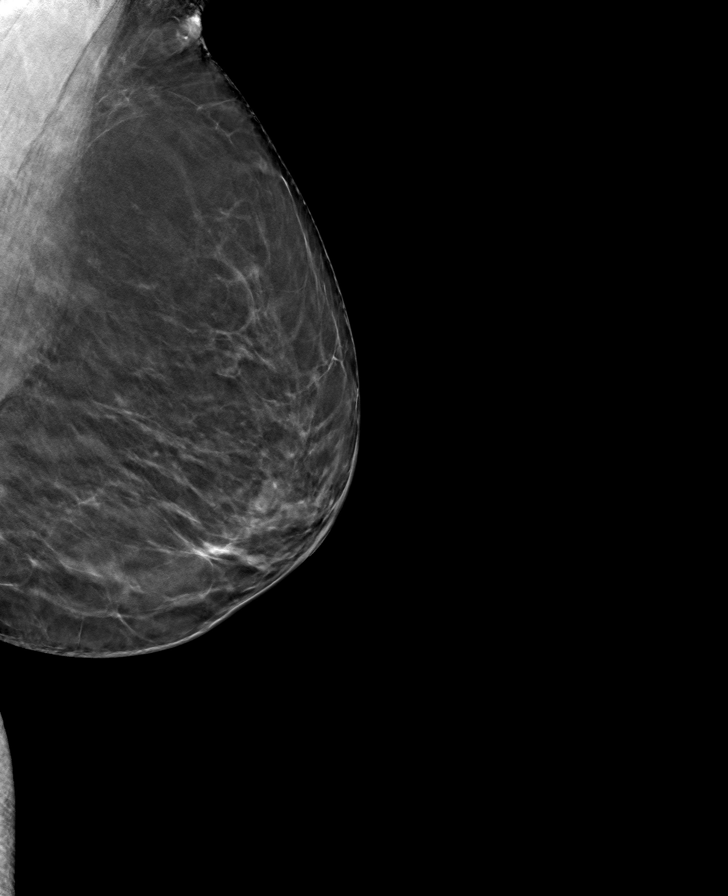

[R CC tomo · tomo slice 37/73.0]
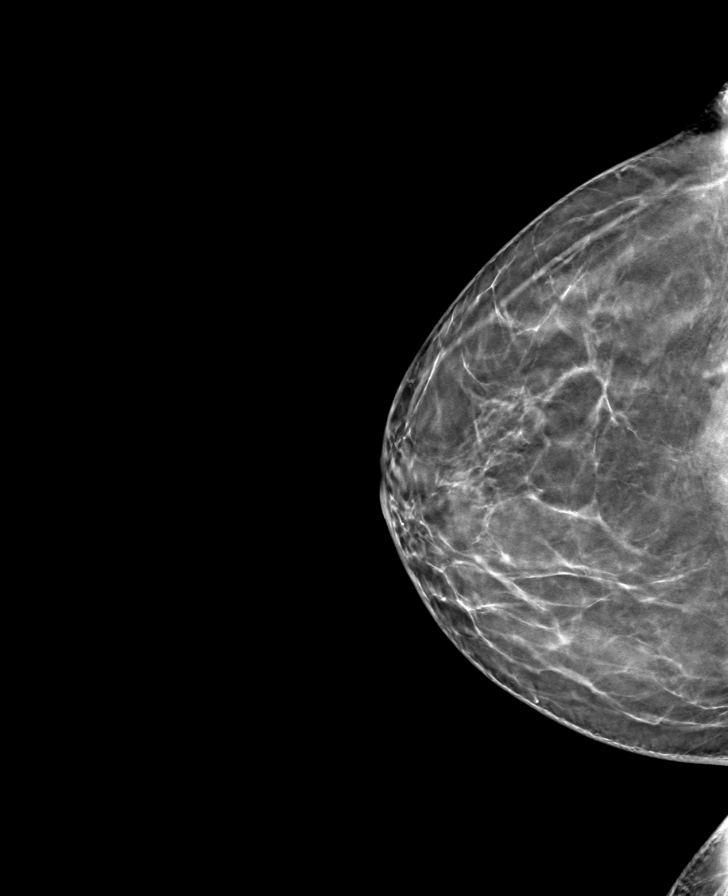

[R MLO tomo · tomo slice 36/71.0]
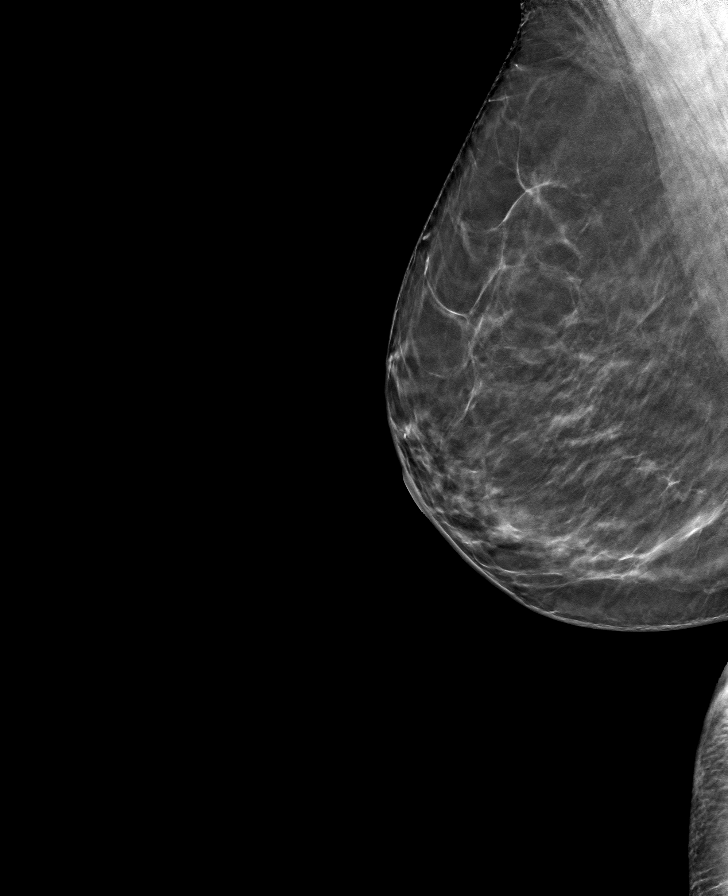

[8 of 24 positions shown; findings below may reference images not displayed]

ACR Breast Density Category b: There are scattered areas of
fibroglandular density.
FINDINGS: There are no findings suspicious for malignancy.
IMPRESSION: No mammographic evidence of malignancy. A result letter of this
screening mammogram will be mailed directly to the patient.

RECOMMENDATION:
Screening mammogram in one year. (Code:51-O-LD2)

BI-RADS CATEGORY  1: Negative.

## 2022-11-20 ENCOUNTER — Ambulatory Visit: Payer: 59 | Attending: Internal Medicine | Admitting: Internal Medicine

## 2022-11-20 ENCOUNTER — Encounter: Payer: Self-pay | Admitting: Internal Medicine

## 2022-11-20 VITALS — BP 120/79 | HR 66 | Temp 98.2°F | Ht 62.0 in | Wt 188.0 lb

## 2022-11-20 DIAGNOSIS — R7303 Prediabetes: Secondary | ICD-10-CM

## 2022-11-20 DIAGNOSIS — Z1231 Encounter for screening mammogram for malignant neoplasm of breast: Secondary | ICD-10-CM | POA: Diagnosis not present

## 2022-11-20 DIAGNOSIS — E782 Mixed hyperlipidemia: Secondary | ICD-10-CM | POA: Diagnosis not present

## 2022-11-20 DIAGNOSIS — E669 Obesity, unspecified: Secondary | ICD-10-CM | POA: Diagnosis not present

## 2022-11-20 NOTE — Progress Notes (Signed)
Patient ID: [name], female    DOB: [birth year]  MRN: [id number]  CC: Follow-up (No questions / concerns. /No to flu vax. )   Subjective: Jo Williamson Williamson is a 52 y.o. female who presents for chronic ds management Her concerns today include:  Fhx of premature CAD (followed by Dr. Terri Skains), over wgh, HL   On last visit, patient expressed concern about weight gain despite good eating habits and regular exercise.  We referred her to a nutritionist.  She did see the dietitian and found it helpful; did learn some things and implemented Thyroid level was normal.  A1c was in the range for prediabetes at 6.2. Still rides stationary bike. Does it for 10 mins most days after dinner and does stretching. Plans to start walking as spring arrives Wgh stable since last visit based on our scale.  Last check at home was around 185 lbs. Last cholesterol was checked in April of last year.  We agreed to hold off on rechecking until her annual/physical exam which we will schedule for 6 weeks.  HM: Coming due for mammogram next month. Patient Active Problem List   Diagnosis Date Noted   Overweight (BMI 25.0-29.9) 12/15/2021     Current Outpatient Medications on File Prior to Visit  Medication Sig Dispense Refill   Ascorbic Acid (VITAMIN C) 1000 MG tablet Take 1,000 mg by mouth daily.     Multiple Vitamins-Minerals (WOMENS MULTIVITAMIN PO) Take 1 tablet by mouth daily.     No current facility-administered medications on file prior to visit.    Allergies  Allergen Reactions   Shrimp [Shellfish Allergy] Hives    Social History   Socioeconomic History   Marital status: Divorced    Spouse name: Not on file   Number of children: 2   Years of education: Not on file   Highest education level: Not on file  Occupational History   Occupation: Printmaker for [employer]  Tobacco Use   Smoking status: Never   Smokeless tobacco: Never  Vaping Use   Vaping Use: Never used   Substance and Sexual Activity   Alcohol use: Yes    Comment: occ   Drug use: Not Currently   Sexual activity: Not on file  Other Topics Concern   Not on file  Social History Narrative   Not on file   Social Determinants of Health   Financial Resource Strain: Not on file  Food Insecurity: Not on file  Transportation Needs: Not on file  Physical Activity: Not on file  Stress: Not on file  Social Connections: Not on file  Intimate Partner Violence: Not on file    Family History  Problem Relation Age of Onset   Heart attack Mother    Diabetes Father     Past Surgical History:  Procedure Laterality Date   ABDOMINAL HYSTERECTOMY     CESAREAN SECTION     HYSTERECTOMY ABDOMINAL WITH SALPINGECTOMY      ROS: Review of Systems Negative except as stated above  PHYSICAL EXAM: BP 120/79 (BP Location: Left Arm, Patient Position: Sitting, Cuff Size: Normal)   Pulse 66   Temp 98.2 F (36.8 C) (Oral)   Ht '5\' 2"'$  (1.575 m)   Wt 188 lb (85.3 kg)   SpO2 100%   BMI 34.39 kg/m   Wt Readings from Last 3 Encounters:  11/20/22 188 lb (85.3 kg)  06/19/22 189 lb 3.2 oz (85.8 kg)  01/09/22 182 lb (82.6 kg)  Physical Exam  General appearance - alert, well appearing, middle age AAF and in no distress Mental status - normal mood, behavior, speech, dress, motor activity, and thought processes Neck - supple, no significant adenopathy Chest - clear to auscultation, no wheezes, rales or rhonchi, symmetric air entry Heart - normal rate, regular rhythm, normal S1, S2, no murmurs, rubs, clicks or gallops Extremities - peripheral pulses normal, no pedal edema, no clubbing or cyanosis      Latest Ref Rng & Units 12/15/2021    9:35 AM  CMP  Glucose 70 - 99 mg/dL 92   BUN 6 - 24 mg/dL 17   Creatinine 0.57 - 1.00 mg/dL 0.76   Sodium 134 - 144 mmol/L 144   Potassium 3.5 - 5.2 mmol/L 4.1   Chloride 96 - 106 mmol/L 107   CO2 20 - 29 mmol/L 23   Calcium 8.7 - 10.2 mg/dL 9.6   Total  Protein 6.0 - 8.5 g/dL 6.9   Total Bilirubin 0.0 - 1.2 mg/dL 0.2   Alkaline Phos 44 - 121 IU/L 49   AST 0 - 40 IU/L 17   ALT 0 - 32 IU/L 14    Lipid Panel     Component Value Date/Time   CHOL 208 (H) 12/15/2021 0935   TRIG 51 12/15/2021 0935   HDL 60 12/15/2021 0935   CHOLHDL 3.5 12/15/2021 0935   LDLCALC 139 (H) 12/15/2021 0935   LDLDIRECT 154 (H) 04/12/2021 0826    CBC    Component Value Date/Time   WBC 3.6 06/19/2022 0923   RBC 4.83 06/19/2022 0923   HGB 13.2 06/19/2022 0923   HCT 40.5 06/19/2022 0923   PLT 328 06/19/2022 0923   MCV 84 06/19/2022 0923   MCH 27.3 06/19/2022 0923   MCHC 32.6 06/19/2022 0923   RDW 13.1 06/19/2022 0923   LYMPHSABS 1.5 06/19/2022 0923   EOSABS 0.1 06/19/2022 0923   BASOSABS 0.0 06/19/2022 0923    ASSESSMENT AND PLAN:  1. Obesity (BMI 30.0-34.9) 2. Prediabetes Encouraged her to continue healthy eating habits. Advised to try to increase the moderate intensity exercise to a total of 150 minutes/week  3. Encounter for screening mammogram for malignant neoplasm of breast - MM Digital Screening; Future  4. Mixed hyperlipidemia We will plan to recheck on subsequent visit    Patient was given the opportunity to ask questions.  Patient verbalized understanding of the plan and was able to repeat key elements of the plan.   This documentation was completed using Radio producer.  Any transcriptional errors are unintentional.  No orders of the defined types were placed in this encounter.    Requested Prescriptions    No prescriptions requested or ordered in this encounter    No follow-ups on file.  Karle Plumber, MD, FACP

## 2022-11-20 NOTE — Patient Instructions (Signed)
Try to increase your moderate intensity exercise to at least 150 minutes total per week.  Order placed for your mammogram.

## 2022-11-27 ENCOUNTER — Encounter: Payer: 59 | Attending: Internal Medicine | Admitting: Dietician

## 2022-11-27 ENCOUNTER — Encounter: Payer: Self-pay | Admitting: Dietician

## 2022-11-27 DIAGNOSIS — Z6834 Body mass index (BMI) 34.0-34.9, adult: Secondary | ICD-10-CM | POA: Insufficient documentation

## 2022-11-27 DIAGNOSIS — Z713 Dietary counseling and surveillance: Secondary | ICD-10-CM | POA: Insufficient documentation

## 2022-11-27 DIAGNOSIS — E669 Obesity, unspecified: Secondary | ICD-10-CM | POA: Diagnosis present

## 2022-11-27 NOTE — Progress Notes (Signed)
Medical Nutrition Therapy  Appointment Start time:  [phone]  Appointment End time:  [phone]  Primary concerns today: Pt states she was referred by her doctor with concern of prediabetes and wants to know what she can work on as far as nutrition and lifestyle goes.    Referral diagnosis: E66.9 Preferred learning style: no preference indicated Learning readiness: ready   NUTRITION ASSESSMENT   Anthropometrics  Ht: 62in Wt: not assessed  Clinical Medical Hx: HLD, prediabetes Medications: none Labs: 12/15/21: chol 208, LDL 139. 06/19/22: A1c 6.2% Notable Signs/Symptoms: none Food Allergies: shellfish- shrimp only  Lifestyle & Dietary Hx  Pt states she feels like her eating habits are better but hasn't noticed changes in her weight.   Pt states she has been trying to balance her plate with more vegetables and complex carbohydrates.   Pt reports she has switched to whole wheat pasta and now usually makes a tomato-based sauce instead of cream-based when making pasta.   Pt states since the time change she has been walking a mile in the evening.   Pt reports her sleep has gotten a little better since previous visit.   Supplements: vitamin C, hair skin and nails Sleep: 6-6.5 hours, pt feels rested Stress / self-care: mild stress  Current average weekly physical activity: stretches daily, mile walk or stationary bike if bad weather.   24-Hr Dietary Recall First Meal: plain oatmeal with blueberries Snack: nuts OR none Second Meal: most often packs leftovers: baked chicken wings with veggies and roasted potatoes OR occasionally goes out (panera salad) Snack: none OR nuts Third Meal: garden salad with whole wheat spaghetti and meat sauce Snack: none OR occasional dessert  Beverages: hot tea, water 48 oz, occasional ginger-ale (1x/wk).    NUTRITION DIAGNOSIS  [place]-2.2 Altered nutrition-related laboratory As related to prediabetes.  As evidenced by A1c 6.2%.   NUTRITION INTERVENTION   Nutrition education (E-1) on the following topics:  Impact of saturated fats on blood cholesterol Benefits of physical activity on blood glucose control MyPlate Building balanced meals and snacks Hydration status and goals Strategies to create a balanced smoothie Differences between plant-based and milk-based protein powders/shakes  Handouts Provided Include  Build Your Own Smoothie  Learning Style & Readiness for Change Teaching method utilized: Visual & Auditory  Demonstrated degree of understanding via: Teach Back  Barriers to learning/adherence to lifestyle change: none  Goals Established by Pt Previous Goals: Aim for 150 minutes of physical activity weekly. - in progress -get back to your mile at work after lunch -continue biking after dinner  Try to make 1/2 of your plate non-starchy vegetables. - in progress  Make simple meals at home more often than eating out. - in progress  New Goals: Goal: Drink 4 water bottles daily. One in the morning, one by lunch, one by dinner, one by bed.  Goal: increase walking from 1 mile to 1.5 miles.   MONITORING & EVALUATION Dietary intake, weekly physical activity, and follow up in 6 months.  Next Steps  Patient is to call for questions.

## 2023-01-02 ENCOUNTER — Encounter: Payer: 59 | Admitting: Internal Medicine

## 2023-01-04 ENCOUNTER — Ambulatory Visit
Admission: RE | Admit: 2023-01-04 | Discharge: 2023-01-04 | Disposition: A | Discharge status: Home / Self Care | Payer: 59 | Source: Ambulatory Visit | Attending: Internal Medicine | Admitting: Internal Medicine

## 2023-01-04 DIAGNOSIS — Z1231 Encounter for screening mammogram for malignant neoplasm of breast: Secondary | ICD-10-CM

## 2023-01-08 ENCOUNTER — Ambulatory Visit: Payer: 59 | Admitting: Cardiology

## 2023-01-15 ENCOUNTER — Ambulatory Visit: Payer: 59 | Admitting: Cardiology

## 2023-01-22 ENCOUNTER — Ambulatory Visit: Payer: 59 | Attending: Physician Assistant | Admitting: Physician Assistant

## 2023-01-22 ENCOUNTER — Encounter: Payer: Self-pay | Admitting: Physician Assistant

## 2023-01-22 VITALS — BP 112/74 | HR 68 | Ht 64.0 in | Wt 187.2 lb

## 2023-01-22 DIAGNOSIS — R7303 Prediabetes: Secondary | ICD-10-CM | POA: Diagnosis not present

## 2023-01-22 DIAGNOSIS — R21 Rash and other nonspecific skin eruption: Secondary | ICD-10-CM | POA: Diagnosis not present

## 2023-01-22 DIAGNOSIS — B029 Zoster without complications: Secondary | ICD-10-CM | POA: Diagnosis not present

## 2023-01-22 MED ORDER — TRIAMCINOLONE ACETONIDE 0.1 % EX CREA: 1.0000 | TOPICAL_CREAM | Freq: Two times a day (BID) | CUTANEOUS | 0 refills | Status: DC

## 2023-01-22 MED ORDER — VALACYCLOVIR HCL 500 MG PO TABS: 500.0000 mg | ORAL_TABLET | Freq: Three times a day (TID) | ORAL | 0 refills | Status: DC

## 2023-01-22 MED ORDER — PREDNISONE 10 MG PO TABS: ORAL_TABLET | ORAL | 0 refills | Status: DC

## 2023-01-22 NOTE — Progress Notes (Signed)
Pt is here for rash  Complaining of rash on her upper right arm started X5 days ago   Redness, itching and burning sensation to same

## 2023-01-22 NOTE — Progress Notes (Signed)
Patient ID: Jo Williamson, female   DOB: 04-13-1971, 52 y.o.   MRN: [id number]   Jo Williamson, is a 52 y.o. female  [id number]  [id number]  DOB - [birth year]  No chief complaint on file.      Subjective:   Jo Williamson is a 53 y.o. female here today for a rash that started erupting a few days ago around the R frontal deltoid.  It itches and slightly burns and tingles and feels funny when her clothes tough it.  She previously had the shingles vaccine a year or 2 ago.  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  No fever.  She does feel tired.    No problems updated.  ALLERGIES: Allergies  Allergen Reactions   Shrimp [Shellfish Allergy] Hives    PAST MEDICAL HISTORY: History reviewed. No pertinent past medical history.  MEDICATIONS AT HOME: Prior to Admission medications   Medication Sig Start Date End Date Taking? Authorizing Provider  Ascorbic Acid (VITAMIN C) 1000 MG tablet Take 1,000 mg by mouth daily.   Yes [provider]  Multiple Vitamins-Minerals (WOMENS MULTIVITAMIN PO) Take 1 tablet by mouth daily.   Yes [provider]  predniSONE (DELTASONE) 10 MG tablet 6,5,4,3,2,1 take each days dose all at once with food in the morning 01/22/23  Yes Taher Vannote M, PA-C  triamcinolone cream (KENALOG) 0.1 % Apply 1 Application topically 2 (two) times daily. 01/22/23  Yes Shaquilla Kehres, Marzella Schlein, PA-C  valACYclovir (VALTREX) 500 MG tablet Take 1 tablet (500 mg total) by mouth 3 (three) times daily. 01/22/23  Yes Modesto Ganoe, Marzella Schlein, PA-C    ROS: Neg HEENT Neg resp Neg cardiac Neg GI Neg GU Neg MS Neg psych Neg neuro  Objective:   Vitals:   01/22/23 0954  BP: 112/74  Pulse: 68  SpO2: 100%  Weight: 187 lb 3.2 oz (84.9 kg)  Height: 5\' 4"  (1.626 m)   Exam General appearance : Awake, alert, not in any distress. Speech Clear. Not toxic  looking HEENT: Atraumatic and Normocephalic, pupils equally reactive to light and accomodation Neck: Supple, no JVD. No cervical lymphadenopathy.  Chest: Good air entry bilaterally, CTAB.  No rales/rhonchi/wheezing CVS: S1 S2 regular, no murmurs.  Extremities: B/L Lower Ext shows no edema, both legs are warm to touch Neurology: Awake alert, and oriented X 3, CN II-XII intact, Non focal Skin: rash around C6 that is erythematous and feels warm to the touch with a maculopapular rash with dysthesia Data Review Lab Results  Component Value Date   HGBA1C 6.2 (H) 06/19/2022    Assessment & Plan   1. Herpes zoster without complication Likely will be mild since she was immunized - valACYclovir (VALTREX) 500 MG tablet; Take 1 tablet (500 mg total) by mouth 3 (three) times daily.  Dispense: 21 tablet; Refill: 0 - predniSONE (DELTASONE) 10 MG tablet; 6,5,4,3,2,1 take each days dose all at once with food in the morning  Dispense: 21 tablet; Refill: 0 (Hold prednisone unless worsens and A1C is back) 2. Rash - triamcinolone cream (KENALOG) 0.1 %; Apply 1 Application topically 2 (two) times daily.  Dispense: 45 g; Refill: 0  3. Prediabetes  I have advised the patient to work at a goal of eliminating sugary drinks, candy, desserts, sweets, refined sugars, processed foods, and white carbohydrates.  - Hemoglobin A1c  Return in about 3 months (around 04/24/2023) for PCP(Johnson)  for chronic conditions.  The patient was given clear instructions to go to ER or return to medical center if symptoms don't improve, worsen or new problems develop. The patient verbalized understanding. The patient was told to call to get lab results if they haven't heard anything in the next week.      Georgian Co, PA-C [facility] [place], [place] [phone]   01/22/2023, 10:30 AM

## 2023-01-22 NOTE — Patient Instructions (Addendum)
work at a goal of eliminating sugary drinks, candy, desserts, sweets, refined sugars, processed foods, and white carbohydrates.  Drink 80 to 100 ounces water daily  Shingles  Shingles is an infection. It gives you a painful skin rash and blisters that have fluid in them. Shingles is caused by the same germ (virus) that causes chickenpox. Shingles only happens in people who: Have had chickenpox. Have been given a shot (vaccine) to protect against chickenpox. Shingles is rare in this group. What are the causes? This condition is caused by varicella-zoster virus. This is the same germ that causes chickenpox. After a person is exposed to the germ, the germ stays in the body but is not active (dormant). Shingles develops if the germ becomes active again (is reactivated). This can happen many years after the first exposure to the germ. It is not known what causes this germ to become active again. What increases the risk? People who have had chickenpox or received the chickenpox shot are at risk for shingles. This infection is more common in people who: Are older than 54 years of age. Have a weakened disease-fighting system (immune system), such as people with: HIV (human immunodeficiency virus). AIDS (acquired immunodeficiency syndrome). Cancer. Are taking medicines that weaken the immune system, such as organ transplant medicines. Have a lot of stress. What are the signs or symptoms? The first symptoms of shingles may be itching, tingling, or pain in an area on your skin. A rash will show on your skin a few days or weeks later. This is what usually happens: The rash is likely to be on one side of your body. The rash usually has a shape like a belt or a band. Over time, the rash turns into fluid-filled blisters. The blisters will break open and change into scabs. The scabs usually dry up in about 2-3 weeks. You may also have: A fever. Chills. A headache. A feeling like you may vomit  (nausea). How is this treated? The rash may last for several weeks. There is not a specific cure for this condition. Your doctor may prescribe medicines. Medicines may: Help with pain. Help you get better sooner. Help to prevent long-term problems. Help with itching (antihistamines). If the area involved is on your face, you may need to see a specialist. This may be an eye doctor or an ear, nose, and throat (ENT) doctor. Follow these instructions at home: Medicines Take over-the-counter and prescription medicines only as told by your doctor. Put on an anti-itch cream or numbing cream where you have a rash, blisters, or scabs. Do this as told by your doctor. Helping with itching and discomfort  Put cold, wet cloths (cold compresses) on the area of the rash or blisters as told by your doctor. Cool baths can help you feel better. Try adding baking soda or dry oatmeal to the water to lessen itching. Do not bathe in hot water. Use calamine lotion as told by your doctor. Blister and rash care Keep your rash covered with a loose bandage (dressing). Wear loose clothing that does not rub on your rash. Wash your hands with soap and water for at least 20 seconds before and after you change your bandage. If you cannot use soap and water, use hand sanitizer. Change your bandage as told by your doctor. Keep your rash and blisters clean. To do this, wash the area with mild soap and cool water as told by your doctor. Check your rash every day for signs of infection. Check  for: More redness, swelling, or pain. Fluid or blood. Warmth. Pus or a bad smell. Do not scratch your rash. Do not pick at your blisters. To help you to not scratch: Keep your fingernails clean and cut short. Wear gloves or mittens when you sleep, if scratching is a problem. General instructions Rest as told by your doctor. Wash your hands often with soap and water for at least 20 seconds. If you cannot use soap and water, use  hand sanitizer. Doing this lowers your chance of getting a skin infection. Your infection can cause chickenpox in people who have never had chickenpox or never got a chickenpox vaccine shot. If you have blisters that did not change into scabs yet, try not to touch other people or be around other people, especially: Babies. Pregnant women. Children who have areas of red, itchy, or rough skin (eczema). Older people who have organ transplants. People who have a long-term (chronic) illness, like cancer or AIDS. Keep all follow-up visits. How is this prevented? A vaccine shot is the best way to prevent shingles and protect against shingles problems. If you have not had a vaccine shot, talk with your doctor about getting it. Where to find more information Centers for Disease Control and Prevention: [employer] Contact a doctor if: Your pain does not get better with medicine. Your pain does not get better after the rash heals. You have any of these signs of infection around the rash: More redness, swelling, or pain. Fluid or blood. Warmth. Pus or a bad smell. You have a fever. Get help right away if: The rash is on your face or nose. You have pain in your face or pain by your eye. You lose feeling on one side of your face. You have trouble seeing. You have ear pain, or you have ringing in your ear. You have a loss of taste. Your condition gets worse. Summary Shingles gives you a painful skin rash and blisters that have fluid in them. Shingles is caused by the same germ (virus) that causes chickenpox. Keep your rash covered with a loose bandage. Wear loose clothing that does not rub on your rash. If you have blisters that did not change into scabs yet, try not to touch other people or be around people. This information is not intended to replace advice given to you by your health care provider. Make sure you discuss any questions you have with your health care provider. Document Revised:  08/23/2020 Document Reviewed: 08/23/2020 Elsevier Patient Education  2023 [employer].

## 2023-01-23 LAB — HEMOGLOBIN A1C
Est. average glucose Bld gHb Est-mCnc: 131 mg/dL
Hgb A1c MFr Bld: 6.2 % — ABNORMAL HIGH (ref 4.8–5.6)

## 2023-01-25 ENCOUNTER — Encounter: Payer: Self-pay | Admitting: Cardiology

## 2023-01-25 ENCOUNTER — Ambulatory Visit: Payer: 59 | Admitting: Cardiology

## 2023-01-25 VITALS — BP 129/76 | HR 78 | Resp 16 | Ht 64.0 in | Wt 186.2 lb

## 2023-01-25 DIAGNOSIS — E7800 Pure hypercholesterolemia, unspecified: Secondary | ICD-10-CM

## 2023-01-25 DIAGNOSIS — Z8249 Family history of ischemic heart disease and other diseases of the circulatory system: Secondary | ICD-10-CM

## 2023-01-25 NOTE — Progress Notes (Signed)
Date:  01/25/2023   ID:  Baldomero Lamy, DOB [birth year], MRN [id number]  PCP:  Marcine Matar, MD  Cardiologist:  Tessa Lerner, DO, [facility] (established care 03/30/2020)  Date: 01/25/23 Last Office Visit: 01/09/2022  Chief Complaint  Patient presents with   Follow-up    Annual follow-up visit HLD    HPI  Jo Williamson is a 52 y.o. female whose past medical history and cardiovascular risk factors include: Hyperlipidemia, Family history of premature coronary artery disease, obesity due to excess calories.  Patient presents today for 1 year follow-up visit given her history of hypercholesterolemia and family history of premature CAD.  Over the last 1 year she is doing well from a cardiovascular standpoint.  She denies anginal chest pain or heart failure symptoms.  No hospitalizations for cardiovascular reasons over the last 1 year.  Most recent lipids from 2023 independently reviewed and noted below for further reference.  Overall functional capacity remains stable.  Mom passed away from MI at age of 44 back in 2011.   FUNCTIONAL STATUS: Walks 30 minutes daily.   ALLERGIES: Allergies  Allergen Reactions   Shrimp [Shellfish Allergy] Hives    MEDICATION LIST PRIOR TO VISIT: Current Meds  Medication Sig   Ascorbic Acid (VITAMIN C) 1000 MG tablet Take 1,000 mg by mouth daily.   Multiple Vitamins-Minerals (WOMENS MULTIVITAMIN PO) Take 1 tablet by mouth daily.   valACYclovir (VALTREX) 500 MG tablet Take 1 tablet (500 mg total) by mouth 3 (three) times daily.     PAST MEDICAL HISTORY: History reviewed. No pertinent past medical history.   PAST SURGICAL HISTORY: Past Surgical History:  Procedure Laterality Date   ABDOMINAL HYSTERECTOMY     CESAREAN SECTION     HYSTERECTOMY ABDOMINAL WITH SALPINGECTOMY      FAMILY HISTORY: The patient family history includes Diabetes in her father; Heart attack in her mother.  SOCIAL HISTORY:  The patient  reports that she has  never smoked. She has never used smokeless tobacco. She reports current alcohol use. She reports that she does not currently use drugs.  REVIEW OF SYSTEMS: Review of Systems  Cardiovascular:  Negative for chest pain, claudication, dyspnea on exertion, irregular heartbeat, leg swelling, near-syncope, orthopnea, palpitations, paroxysmal nocturnal dyspnea and syncope.  Respiratory:  Negative for shortness of breath.   Hematologic/Lymphatic: Negative for bleeding problem.  Musculoskeletal:  Negative for muscle cramps and myalgias.  Neurological:  Negative for dizziness and light-headedness.    PHYSICAL EXAM:    01/25/2023    9:05 AM 01/22/2023    9:54 AM 11/20/2022    8:39 AM  Vitals with BMI  Height 5\' 4"  5\' 4"  5\' 2"   Weight 186 lbs 3 oz 187 lbs 3 oz 188 lbs  BMI 31.95 32.12 34.38  Systolic 129 112 295  Diastolic 76 74 79  Pulse 78 68 66   Physical Exam  Constitutional: No distress.  Age appropriate, hemodynamically stable.   Neck: No JVD present.  Cardiovascular: Normal rate, regular rhythm, S1 normal, S2 normal, intact distal pulses and normal pulses. Exam reveals no gallop, no S3 and no S4.  No murmur heard. Pulses:      Dorsalis pedis pulses are 2+ on the right side and 2+ on the left side.       Posterior tibial pulses are 2+ on the right side and 2+ on the left side.  Pulmonary/Chest: Effort normal and breath sounds normal. No stridor. She has no wheezes. She has no rales.  Abdominal: Soft. Bowel sounds are normal. She exhibits no distension. There is no abdominal tenderness.  Musculoskeletal:        General: No edema.     Cervical back: Neck supple.  Neurological: She is alert and oriented to person, place, and time. She has intact cranial nerves (2-12).  Skin: Skin is warm and moist.     CARDIAC DATABASE: EKG: Jan 25 2023: Sinus rhythm, 76 bpm, occasional PACs, LAE, without underlying ischemia or injury pattern  Echocardiogram: 04/05/2020: LVEF 60-65%, LV cavity  normal in size, mild LVH, normal wall motion, no significant valvular abnormality.  Stress Testing: Exercise treadmill stress test 04/05/2020:  Exercise treadmill stress test performed using Bruce protocol.  Patient reached 7.4 METS, and 76% of age predicted maximum heart rate.  Exercise capacity was low.  No chest pain reported. Attenuated heart rate and blood pressure response to exercise.  Stress EKG with submaximal exercise revealed no ischemic changes.  Inconclusive study. Consider alternate diagnostic tests, if clinically indicated.   Coronary artery calcification scoring performed on April 01, 2020 at [facility]: Total calcium score:0 AU.  Impression: No identifiable calcified coronary artery plaque. Very low, generally less than 5% risk of coronary artery disease.   Heart Catheterization: None  LABORATORY DATA: External Labs: Collected: 03/04/2020 Creatinine 0.66 mg/dL. eGFR: 119 mL/min per 1.73 m Lipid profile: Total cholesterol 233, triglycerides 67, HDL 58, LDL 159, non-HDL 175 TSH: 1.61  Collected:  December 15, 2021 Total cholesterol 2 8, triglycerides 51, HDL L60, LDL 139   Lab Results  Component Value Date   CHOL 208 (H) 12/15/2021   HDL 60 12/15/2021   LDLCALC 139 (H) 12/15/2021   LDLDIRECT 154 (H) 04/12/2021   TRIG 51 12/15/2021   CHOLHDL 3.5 12/15/2021     IMPRESSION:    ICD-10-CM   1. Hypercholesterolemia  E78.00     2. Family history of premature CAD  Z82.49 EKG 12-Lead       RECOMMENDATIONS: Jo Williamson is a 52 y.o. female whose past medical history and cardiac risk factors include: Family history of premature coronary artery disease, obesity due to excess calories.  Given her family history of premature CAD and prior symptoms she did undergo ischemic workup as outlined above.  These results were reviewed today as part of medical decision making.  She presents today for 1 year follow-up visit.  She remains asymptomatic and her overall  functional capacity remains stable.  We discussed the importance of primary prevention.  Her LDL levels are improving but not at goal.  Her estimated 10-year risk of ASCVD is less than 3%.  Shared decision was to continue with lifestyle changes and reducing consumption of lipid rich foods.  She will have repeat fasting lipids with PCP in the coming months as part of her yearly well visit.  Recommended red yeast rice over-the-counter to help facilitate better lipid numbers.   May consider repeat coronary calcium score in 2026 to reevaluate her CAC.  Or sooner if change in clinical status  Shared decision is to follow-up on annual basis sooner if needed.  FINAL MEDICATION LIST END OF ENCOUNTER: No orders of the defined types were placed in this encounter.   There are no discontinued medications.    Current Outpatient Medications:    Ascorbic Acid (VITAMIN C) 1000 MG tablet, Take 1,000 mg by mouth daily., Disp: , Rfl:    Multiple Vitamins-Minerals (WOMENS MULTIVITAMIN PO), Take 1 tablet by mouth daily., Disp: , Rfl:  valACYclovir (VALTREX) 500 MG tablet, Take 1 tablet (500 mg total) by mouth 3 (three) times daily., Disp: 21 tablet, Rfl: 0   predniSONE (DELTASONE) 10 MG tablet, 6,5,4,3,2,1 take each days dose all at once with food in the morning (Patient not taking: Reported on 01/25/2023), Disp: 21 tablet, Rfl: 0   triamcinolone cream (KENALOG) 0.1 %, Apply 1 Application topically 2 (two) times daily. (Patient not taking: Reported on 01/25/2023), Disp: 45 g, Rfl: 0  Orders Placed This Encounter  Procedures   EKG 12-Lead    There are no Patient Instructions on file for this visit.   --Continue cardiac medications as reconciled in final medication list. --Return in about 1 year (around 01/25/2024) for Follow up, Lipid, annual follow-up visit, family history of premature CAD. Or sooner if needed. --Continue follow-up with your primary care physician regarding the management of your other  chronic comorbid conditions.  Patient's questions and concerns were addressed to her satisfaction. She voices understanding of the instructions provided during this encounter.   This note was created using a voice recognition software as a result there may be grammatical errors inadvertently enclosed that do not reflect the nature of this encounter. Every attempt is made to correct such errors.  Tessa Lerner, [place], [facility]  Pager:  [phone] Office: [phone]

## 2023-02-19 ENCOUNTER — Ambulatory Visit: Payer: 59 | Attending: Internal Medicine | Admitting: Internal Medicine

## 2023-02-19 ENCOUNTER — Encounter: Payer: Self-pay | Admitting: Internal Medicine

## 2023-02-19 VITALS — BP 112/75 | HR 60 | Temp 98.4°F | Ht 64.0 in | Wt 186.0 lb

## 2023-02-19 DIAGNOSIS — R232 Flushing: Secondary | ICD-10-CM

## 2023-02-19 DIAGNOSIS — Z1159 Encounter for screening for other viral diseases: Secondary | ICD-10-CM

## 2023-02-19 DIAGNOSIS — R7303 Prediabetes: Secondary | ICD-10-CM

## 2023-02-19 DIAGNOSIS — E669 Obesity, unspecified: Secondary | ICD-10-CM | POA: Diagnosis not present

## 2023-02-19 DIAGNOSIS — Z114 Encounter for screening for human immunodeficiency virus [HIV]: Secondary | ICD-10-CM

## 2023-02-19 DIAGNOSIS — Z0000 Encounter for general adult medical examination without abnormal findings: Secondary | ICD-10-CM | POA: Diagnosis not present

## 2023-02-19 NOTE — Progress Notes (Signed)
Patient ID: Jo Williamson, female    DOB: 1971-01-01  MRN: [id number]  CC: Annual Exam (Physical./Weight gain concerns)   Subjective: Jo Williamson is a 52 y.o. female who presents for physical Her concerns today include:  Fhx of premature CAD (followed by Dr. Odis Hollingshead), over wgh, HL, PreDM   Obesity:  down 2 lbs since last visit She is doing well with eating habits and saw nutritionist 2-3 times.  Walks daily for 1 mile after eating at work.  Plans to walk more in the evenings Not losing as much as she would like  Having a lot of hot flashes Had total hysterectomy 2009 for fibroids.  HM: due for HIV and Hep C screen  Patient Active Problem List   Diagnosis Date Noted   Overweight (BMI 25.0-29.9) 12/15/2021     Current Outpatient Medications on File Prior to Visit  Medication Sig Dispense Refill   Ascorbic Acid (VITAMIN C) 1000 MG tablet Take 1,000 mg by mouth daily.     Multiple Vitamins-Minerals (WOMENS MULTIVITAMIN PO) Take 1 tablet by mouth daily.     No current facility-administered medications on file prior to visit.    Allergies  Allergen Reactions   Shrimp [Shellfish Allergy] Hives    Social History   Socioeconomic History   Marital status: Divorced    Spouse name: Not on file   Number of children: 2   Years of education: Not on file   Highest education level: Not on file  Occupational History   Occupation: Forensic psychologist for [employer]  Tobacco Use   Smoking status: Never   Smokeless tobacco: Never  Vaping Use   Vaping Use: Never used  Substance and Sexual Activity   Alcohol use: Yes    Comment: occ   Drug use: Not Currently   Sexual activity: Not on file  Other Topics Concern   Not on file  Social History Narrative   Not on file   Social Determinants of Health   Financial Resource Strain: Not on file  Food Insecurity: Not on file  Transportation Needs: Not on file  Physical Activity: Not on file  Stress: Not on file   Social Connections: Not on file  Intimate Partner Violence: Not on file    Family History  Problem Relation Age of Onset   Heart attack Mother    Diabetes Father     Past Surgical History:  Procedure Laterality Date   ABDOMINAL HYSTERECTOMY     CESAREAN SECTION     HYSTERECTOMY ABDOMINAL WITH SALPINGECTOMY      ROS: Review of Systems  HENT:  Negative for dental problem, hearing loss and sore throat.        Sees dentist twice a year for routine cleaning.  Eyes:        No blurred vision.  Wears contacts.  Had eye exam earlier this year.  Respiratory:  Negative for shortness of breath.   Cardiovascular:  Negative for chest pain.  Gastrointestinal:        No blood in his stools.  Moving bowels okay.  Genitourinary:  Negative for difficulty urinating and hematuria.   Negative except as stated above  PHYSICAL EXAM: BP 112/75 (BP Location: Left Arm, Patient Position: Sitting, Cuff Size: Normal)   Pulse 60   Temp 98.4 F (36.9 C) (Oral)   Ht 5\' 4"  (1.626 m)   Wt 186 lb (84.4 kg)   SpO2 100%   BMI 31.93 kg/m   Wt Readings  from Last 3 Encounters:  02/19/23 186 lb (84.4 kg)  01/25/23 186 lb 3.2 oz (84.5 kg)  01/22/23 187 lb 3.2 oz (84.9 kg)    Physical Exam  General appearance - alert, well appearing, middle-aged African-American female and in no distress Mental status - normal mood, behavior, speech, dress, motor activity, and thought processes Eyes - pupils equal and reactive, extraocular eye movements intact Ears - bilateral TM's and external ear canals normal Nose - normal and patent, no erythema, discharge or polyps Mouth - mucous membranes moist, pharynx normal without lesions Neck - supple, no significant adenopathy Lymphatics - no palpable lymphadenopathy, no hepatosplenomegaly Chest - clear to auscultation, no wheezes, rales or rhonchi, symmetric air entry Heart - normal rate, regular rhythm, normal S1, S2, no murmurs, rubs, clicks or gallops Abdomen - soft,  nontender, nondistended, no masses or organomegaly Neurological - cranial nerves II through XII intact, motor and sensory grossly normal bilaterally Musculoskeletal - no joint tenderness, deformity or swelling.  She has mild crepitus on passive movement of both knees. Extremities - peripheral pulses normal, no pedal edema, no clubbing or cyanosis Skin - normal coloration and turgor, no rashes, no suspicious skin lesions noted      Latest Ref Rng & Units 12/15/2021    9:35 AM  CMP  Glucose 70 - 99 mg/dL 92   BUN 6 - 24 mg/dL 17   Creatinine 1.61 - 1.00 mg/dL 0.96   Sodium 045 - 409 mmol/L 144   Potassium 3.5 - 5.2 mmol/L 4.1   Chloride 96 - 106 mmol/L 107   CO2 20 - 29 mmol/L 23   Calcium 8.7 - 10.2 mg/dL 9.6   Total Protein 6.0 - 8.5 g/dL 6.9   Total Bilirubin 0.0 - 1.2 mg/dL 0.2   Alkaline Phos 44 - 121 IU/L 49   AST 0 - 40 IU/L 17   ALT 0 - 32 IU/L 14    Lipid Panel     Component Value Date/Time   CHOL 208 (H) 12/15/2021 0935   TRIG 51 12/15/2021 0935   HDL 60 12/15/2021 0935   CHOLHDL 3.5 12/15/2021 0935   LDLCALC 139 (H) 12/15/2021 0935   LDLDIRECT 154 (H) 04/12/2021 0826    CBC    Component Value Date/Time   WBC 3.6 06/19/2022 0923   RBC 4.83 06/19/2022 0923   HGB 13.2 06/19/2022 0923   HCT 40.5 06/19/2022 0923   PLT 328 06/19/2022 0923   MCV 84 06/19/2022 0923   MCH 27.3 06/19/2022 0923   MCHC 32.6 06/19/2022 0923   RDW 13.1 06/19/2022 0923   LYMPHSABS 1.5 06/19/2022 0923   EOSABS 0.1 06/19/2022 0923   BASOSABS 0.0 06/19/2022 0923    ASSESSMENT AND PLAN: 1. Annual physical exam   2. Obesity (BMI 30.0-34.9) 3. Prediabetes Encouraged her to continue healthy eating habits and regular exercise. Discussed trying her with [facility] to help her achieve her weight goal.  Patient not too keen on the idea of taking medication lifelong to maintain weight.  She will think about this and let me know if she changes her mind.  I also offered referral to medical weight  management and explained to her what that program entails.  She will think about that as well.  4. Hot flashes Discussed how decreasing estrogen levels in menopause brings about most of the symptoms especially the hot flashes. We discussed the use of hormone replacement therapy for her hot flashes.  She would be a good candidate.  Advised  that if we do put her on HRT, we can go with oral or estrogen patch.  Goal is to maintain on the lowest effective dose for about 5 years.  After 5 years, we try to get women off of it because of slight increased risk of breast cancer.  Patient wants to think about it first.  She is up-to-date with mammogram.  5. Screening for HIV (human immunodeficiency virus) - HIV antibody (with reflex)  6. Need for hepatitis C screening test - Hepatitis C Antibody     Patient was given the opportunity to ask questions.  Patient verbalized understanding of the plan and was able to repeat key elements of the plan.   This documentation was completed using Paediatric nurse.  Any transcriptional errors are unintentional.  Orders Placed This Encounter  Procedures   HIV antibody (with reflex)   Hepatitis C Antibody     Requested Prescriptions    No prescriptions requested or ordered in this encounter    Return in about 6 months (around 08/21/2023).  Jonah Blue, MD, FACP

## 2023-02-19 NOTE — Patient Instructions (Signed)

## 2023-02-20 LAB — HIV ANTIBODY (ROUTINE TESTING W REFLEX): HIV Screen 4th Generation wRfx: NONREACTIVE

## 2023-02-20 LAB — HEPATITIS C ANTIBODY: Hep C Virus Ab: NONREACTIVE

## 2023-05-29 ENCOUNTER — Ambulatory Visit: Payer: 59 | Admitting: Dietician

## 2023-08-21 ENCOUNTER — Ambulatory Visit: Payer: 59 | Attending: Internal Medicine | Admitting: Internal Medicine

## 2023-08-21 ENCOUNTER — Encounter: Payer: Self-pay | Admitting: Internal Medicine

## 2023-08-21 VITALS — BP 117/78 | HR 77 | Temp 98.6°F | Ht 64.0 in | Wt 189.0 lb

## 2023-08-21 DIAGNOSIS — L659 Nonscarring hair loss, unspecified: Secondary | ICD-10-CM | POA: Diagnosis not present

## 2023-08-21 DIAGNOSIS — R7303 Prediabetes: Secondary | ICD-10-CM | POA: Diagnosis not present

## 2023-08-21 DIAGNOSIS — Z2821 Immunization not carried out because of patient refusal: Secondary | ICD-10-CM

## 2023-08-21 DIAGNOSIS — R232 Flushing: Secondary | ICD-10-CM | POA: Diagnosis not present

## 2023-08-21 DIAGNOSIS — E66811 Obesity, class 1: Secondary | ICD-10-CM

## 2023-08-21 LAB — POCT GLYCOSYLATED HEMOGLOBIN (HGB A1C): HbA1c, POC (controlled diabetic range): 5.9 % (ref 0.0–7.0)

## 2023-08-21 LAB — GLUCOSE, POCT (MANUAL RESULT ENTRY): POC Glucose: 109 mg/dL — AB (ref 70–99)

## 2023-08-21 NOTE — Progress Notes (Signed)
Patient ID: ANELIESE VARLAND, female    DOB: 04-27-1971  MRN: [id number]  CC: Follow-up (Follow-up. /No questions / concerns/No to flu vax.)   Subjective: Jo Williamson is a 52 y.o. female who presents for chronic ds management. Her concerns today include:  Fhx of premature CAD (followed by Dr. Odis Hollingshead), over wgh, HL, PreDM   Obesity/PreDM:  Results for orders placed or performed in visit on 08/21/23  POCT glycosylated hemoglobin (Hb A1C)  Result Value Ref Range   Hemoglobin A1C     HbA1c POC (<> result, manual entry)     HbA1c, POC (prediabetic range)     HbA1c, POC (controlled diabetic range) 5.9 0.0 - 7.0 %  POCT glucose (manual entry)  Result Value Ref Range   POC Glucose 109 (A) 70 - 99 mg/dl  Doing well with eating habits. Treats herself to a good burger about once a mth or every other months.  Eat mainly bake/broil foods.  Good with fruits and veggies intake. Weakness is roasted potates and rice about 2-3 times a wk -very active during the day.  Get up and walk about every hr at work.  Also uses her stationary bike in the evenings.   Hot flashes: about the same but not as bad. Thanks it may be due cooler temp. Decline HRT last visit.  I note that she has some baldness at center front of head.  Pt said there for past 15 yrs, thinner as the yrs go by.  Pt thinks it is hereditary as her mom and maternal aunts had the same.  She has been natural for the past 10 yrs. Patient Active Problem List   Diagnosis Date Noted   Overweight (BMI 25.0-29.9) 12/15/2021     Current Outpatient Medications on File Prior to Visit  Medication Sig Dispense Refill   Ascorbic Acid (VITAMIN C) 1000 MG tablet Take 1,000 mg by mouth daily.     Multiple Vitamins-Minerals (WOMENS MULTIVITAMIN PO) Take 1 tablet by mouth daily.     No current facility-administered medications on file prior to visit.    Allergies  Allergen Reactions   Shrimp [Shellfish Allergy] Hives    Social History    Socioeconomic History   Marital status: Divorced    Spouse name: Not on file   Number of children: 2   Years of education: Not on file   Highest education level: 12th grade  Occupational History   Occupation: Forensic psychologist for [employer]  Tobacco Use   Smoking status: Never   Smokeless tobacco: Never  Vaping Use   Vaping status: Never Used  Substance and Sexual Activity   Alcohol use: Yes    Comment: occ   Drug use: Not Currently   Sexual activity: Not on file  Other Topics Concern   Not on file  Social History Narrative   Not on file   Social Determinants of Health   Financial Resource Strain: Low Risk  (08/21/2023)   Overall Financial Resource Strain (CARDIA)    Difficulty of Paying Living Expenses: Not hard at all  Food Insecurity: No Food Insecurity (08/21/2023)   Hunger Vital Sign    Worried About Running Out of Food in the Last Year: Never true    Ran Out of Food in the Last Year: Never true  Transportation Needs: No Transportation Needs (08/21/2023)   PRAPARE - Administrator, Civil Service (Medical): No    Lack of Transportation (Non-Medical): No  Physical Activity: Insufficiently  Active (08/21/2023)   Exercise Vital Sign    Days of Exercise per Week: 4 days    Minutes of Exercise per Session: 20 min  Stress: No Stress Concern Present (08/21/2023)   [employer] of Occupational Health - Occupational Stress Questionnaire    Feeling of Stress : Not at all  Social Connections: Moderately Integrated (08/21/2023)   Social Connection and Isolation Panel [NHANES]    Frequency of Communication with Friends and Family: More than three times a week    Frequency of Social Gatherings with Friends and Family: More than three times a week    Attends Religious Services: More than 4 times per year    Active Member of [employer] or Organizations: Yes    Attends Engineer, structural: More than 4 times per year    Marital Status:  Divorced  Recent Concern: Social Connections - Moderately Isolated (08/17/2023)   Social Connection and Isolation Panel [NHANES]    Frequency of Communication with Friends and Family: More than three times a week    Frequency of Social Gatherings with Friends and Family: Three times a week    Attends Religious Services: More than 4 times per year    Active Member of Clubs or Organizations: No    Attends Banker Meetings: Not on file    Marital Status: Divorced  Intimate Partner Violence: Not At Risk (08/21/2023)   Humiliation, Afraid, Rape, and Kick questionnaire    Fear of Current or Ex-Partner: No    Emotionally Abused: No    Physically Abused: No    Sexually Abused: No    Family History  Problem Relation Age of Onset   Heart attack Mother    Diabetes Father     Past Surgical History:  Procedure Laterality Date   ABDOMINAL HYSTERECTOMY     CESAREAN SECTION     HYSTERECTOMY ABDOMINAL WITH SALPINGECTOMY      ROS: Review of Systems Negative except as stated above  PHYSICAL EXAM: BP 117/78 (BP Location: Left Arm, Patient Position: Sitting, Cuff Size: Normal)   Pulse 77   Temp 98.6 F (37 C) (Oral)   Ht 5\' 4"  (1.626 m)   Wt 189 lb (85.7 kg)   SpO2 97%   BMI 32.44 kg/m   Wt Readings from Last 3 Encounters:  08/21/23 189 lb (85.7 kg)  02/19/23 186 lb (84.4 kg)  01/25/23 186 lb 3.2 oz (84.5 kg)    Physical Exam  General appearance - alert, well appearing, middle age AAF and in no distress Mental status - normal mood, behavior, speech, dress, motor activity, and thought processes Neck - supple, no significant adenopathy Chest - clear to auscultation, no wheezes, rales or rhonchi, symmetric air entry Heart - normal rate, regular rhythm, normal S1, S2, no murmurs, rubs, clicks or gallops Skin - mild to moderate thinning of hair at center of forehead.      Latest Ref Rng & Units 12/15/2021    9:35 AM  CMP  Glucose 70 - 99 mg/dL 92   BUN 6 - 24 mg/dL 17    Creatinine 5.40 - 1.00 mg/dL 9.81   Sodium 191 - 478 mmol/L 144   Potassium 3.5 - 5.2 mmol/L 4.1   Chloride 96 - 106 mmol/L 107   CO2 20 - 29 mmol/L 23   Calcium 8.7 - 10.2 mg/dL 9.6   Total Protein 6.0 - 8.5 g/dL 6.9   Total Bilirubin 0.0 - 1.2 mg/dL 0.2   Alkaline Phos 44 -  121 IU/L 49   AST 0 - 40 IU/L 17   ALT 0 - 32 IU/L 14    Lipid Panel     Component Value Date/Time   CHOL 208 (H) 12/15/2021 0935   TRIG 51 12/15/2021 0935   HDL 60 12/15/2021 0935   CHOLHDL 3.5 12/15/2021 0935   LDLCALC 139 (H) 12/15/2021 0935   LDLDIRECT 154 (H) 04/12/2021 0826    CBC    Component Value Date/Time   WBC 3.6 06/19/2022 0923   RBC 4.83 06/19/2022 0923   HGB 13.2 06/19/2022 0923   HCT 40.5 06/19/2022 0923   PLT 328 06/19/2022 0923   MCV 84 06/19/2022 0923   MCH 27.3 06/19/2022 0923   MCHC 32.6 06/19/2022 0923   RDW 13.1 06/19/2022 0923   LYMPHSABS 1.5 06/19/2022 0923   EOSABS 0.1 06/19/2022 0923   BASOSABS 0.0 06/19/2022 0923    ASSESSMENT AND PLAN:  1. Obesity (BMI 30.0-34.9) 2. Prediabetes Commended her on her efforts to try to eat healthy.  Encouraged her to keep up the good works.  Continue regular exercise also as she has been doing. - POCT glycosylated hemoglobin (Hb A1C) - POCT glucose (manual entry)  3. Hot flashes Patient reports that her hot flashes are somewhat better.  She declined HRT on last visit.  I informed her of the new nonhormonal medication known as Veozah including possible side effects of the medication.  She will think about it.  I have printed some information for her about the medication  4. Alopecia This may be hereditary.  TSH last year was normal.  She is agreeable to seeing a dermatologist - Ambulatory referral to Dermatology  5. Influenza vaccination declined  Patient was given the opportunity to ask questions.  Patient verbalized understanding of the plan and was able to repeat key elements of the plan.   This documentation was completed  using Paediatric nurse.  Any transcriptional errors are unintentional.  Orders Placed This Encounter  Procedures   Ambulatory referral to Dermatology   POCT glycosylated hemoglobin (Hb A1C)   POCT glucose (manual entry)     Requested Prescriptions    No prescriptions requested or ordered in this encounter    Return in about 6 months (around 02/19/2024) for physical.  Jonah Blue, MD, [facility]

## 2023-08-21 NOTE — Patient Instructions (Signed)
Here is some information about the medication Veozah (Fezolinetant) for hot flashes. Fezolinetant Tablets What is this medication? FEZOLINETANT (FEZ oh LIN e tant) reduces the number and severity of hot flashes due to menopause. It works by blocking substances in your body that cause hot flashes and night sweats. This medicine may be used for other purposes; ask your health care provider or pharmacist if you have questions. COMMON BRAND NAME(S): VEOZAH What should I tell my care team before I take this medication? They need to know if you have any of these conditions: Kidney disease Liver disease An unusual or allergic reaction to fezolinetant, other medications, foods, dyes, or preservatives Pregnant or trying to get pregnant Breastfeeding How should I use this medication? Take this medication by mouth with water. Take it as directed on the prescription label at the same time every day. Do not cut, crush, or chew this medication. Swallow the tablets whole. You can take it with or without food. If it upsets your stomach, take it with food. Keep taking it unless your care team tells you to stop. Talk to your care team about the use of this medication in children. Special care may be needed. Overdosage: If you think you have taken too much of this medicine contact a poison control center or emergency room at once. NOTE: This medicine is only for you. Do not share this medicine with others. What if I miss a dose? If you miss a dose, take it as soon as you can unless it is more than 12 hours late. If it is more than 12 hours late, skip the missed dose. Take the next dose at the normal time. What may interact with this medication? Other medications may affect the way this medication works. Talk with your care team about all of the medications you take. They may suggest changes to your treatment plan to lower the risk of side effects and to make sure your medications work as intended. This list may  not describe all possible interactions. Give your health care provider a list of all the medicines, herbs, non-prescription drugs, or dietary supplements you use. Also tell them if you smoke, drink alcohol, or use illegal drugs. Some items may interact with your medicine. What should I watch for while using this medication? Visit your care team for regular checks on your progress. Tell your care team if your symptoms do not start to get better or if they get worse. You may need blood work while taking this medication. What side effects may I notice from receiving this medication? Side effects that you should report to your care team as soon as possible: Allergic reactions--skin rash, itching, hives, swelling of the face, lips, tongue, or throat Liver injury--right upper belly pain, loss of appetite, nausea, light-colored stool, dark yellow or brown urine, yellowing skin or eyes, unusual weakness or fatigue Side effects that usually do not require medical attention (report these to your care team if they continue or are bothersome): Back pain Diarrhea Hot flashes Stomach pain Trouble sleeping This list may not describe all possible side effects. Call your doctor for medical advice about side effects. You may report side effects to FDA at 1-800-FDA-1088. Where should I keep my medication? Keep out of the reach of children and pets. Store at room temperature between 20 and 25 degrees C (68 and 77 degrees F). Get rid of any unused medication after the expiration date. To get rid of medications that are no longer needed or  have expired: Take the medication to a medication take-back program. Check with your pharmacy or law enforcement to find a location. If you cannot return the medication, check the label or package insert to see if the medication should be thrown out in the garbage or flushed down the toilet. If you are not sure, ask your care team. If it is safe to put it in the trash, take the  medication out of the container. Mix the medication with cat litter, dirt, coffee grounds, or other unwanted substance. Seal the mixture in a bag or container. Put it in the trash. NOTE: This sheet is a summary. It may not cover all possible information. If you have questions about this medicine, talk to your doctor, pharmacist, or health care provider.  2024 Elsevier/Gold Standard (2022-02-02 00:00:00)

## 2023-10-05 ENCOUNTER — Ambulatory Visit: Payer: 59 | Admitting: Internal Medicine

## 2023-10-29 ENCOUNTER — Telehealth: Payer: Self-pay

## 2023-10-29 ENCOUNTER — Ambulatory Visit: Payer: 59 | Admitting: Internal Medicine

## 2023-10-29 NOTE — Telephone Encounter (Signed)
Call to patient to offer walk in appointment unable to reach message left on VM

## 2023-10-29 NOTE — Telephone Encounter (Signed)
Patient has been called and given an appointment for 11/07/2022 at 1:50   Copied from CRM [id number]. Topic: Clinical - Medical Advice >> Oct 29, 2023 12:15 PM [name] T wrote: Reason for CRM: patient called stated she is experiencing severe back pain and frequent urination. Please f/u with patient as there were no appt

## 2023-10-29 NOTE — Telephone Encounter (Signed)
Copied from CRM [id number]. Topic: Clinical - Medical Advice >> Oct 29, 2023 12:15 PM [name] T wrote: Reason for CRM: patient called stated she is experiencing severe back pain and frequent urination. Please f/u with patient as there were no appt

## 2023-11-01 ENCOUNTER — Other Ambulatory Visit (HOSPITAL_COMMUNITY)
Admission: RE | Admit: 2023-11-01 | Discharge: 2023-11-01 | Disposition: A | Discharge status: Home / Self Care | Payer: 59 | Source: Ambulatory Visit | Attending: Family Medicine | Admitting: Family Medicine

## 2023-11-01 ENCOUNTER — Encounter: Payer: Self-pay | Admitting: Physician Assistant

## 2023-11-01 ENCOUNTER — Ambulatory Visit: Payer: 59 | Attending: Family Medicine | Admitting: Physician Assistant

## 2023-11-01 VITALS — BP 127/84 | HR 64 | Wt 187.0 lb

## 2023-11-01 DIAGNOSIS — M545 Low back pain, unspecified: Secondary | ICD-10-CM

## 2023-11-01 DIAGNOSIS — R350 Frequency of micturition: Secondary | ICD-10-CM | POA: Diagnosis not present

## 2023-11-01 DIAGNOSIS — R300 Dysuria: Secondary | ICD-10-CM | POA: Insufficient documentation

## 2023-11-01 LAB — POCT URINALYSIS DIP (CLINITEK)
Bilirubin, UA: NEGATIVE
Glucose, UA: NEGATIVE mg/dL
Ketones, POC UA: NEGATIVE mg/dL
Leukocytes, UA: NEGATIVE
Nitrite, UA: NEGATIVE
POC PROTEIN,UA: NEGATIVE
Spec Grav, UA: 1.025 (ref 1.010–1.025)
Urobilinogen, UA: 0.2 U/dL
pH, UA: 5.5 (ref 5.0–8.0)

## 2023-11-01 MED ORDER — MELOXICAM 15 MG PO TABS
15.0000 mg | ORAL_TABLET | Freq: Every day | ORAL | 3 refills | Status: DC
Start: 2023-11-01 — End: 2024-02-12

## 2023-11-01 NOTE — Progress Notes (Signed)
Patient ID: Jo Williamson, female   DOB: June 13, 1971, 53 y.o.   MRN: [id number]      Margene Cherian, is a 53 y.o. female  WUJ:[id number]  [id number]  DOB - Apr 13, 1971  Chief Complaint  Patient presents with   Back Pain    Patient stated it started 4 week    Urinary Frequency       Subjective:   Jo Williamson is a 53 y.o. female here today for Urinary frequency for about 4 weeks.  No dysuria.  No flank pain.  No N/V.  No fever.  No vaginal discharge.  No pelvic pain  Low back pain intermittently for a couple months.  No radiating pain or radiculopathy.  NKI.  Back feels tired and sore.  She thinks she may have bad posture.   No problems updated.  ALLERGIES: Allergies  Allergen Reactions   Shrimp [Shellfish Allergy] Hives    PAST MEDICAL HISTORY: No past medical history on file.  MEDICATIONS AT HOME: Prior to Admission medications   Medication Sig Start Date End Date Taking? Authorizing Provider  Ascorbic Acid (VITAMIN C) 1000 MG tablet Take 1,000 mg by mouth daily.   Yes [provider]  meloxicam (MOBIC) 15 MG tablet Take 1 tablet (15 mg total) by mouth daily. Prn pain 11/01/23  Yes Calirose Mccance M, PA-C  Multiple Vitamins-Minerals (WOMENS MULTIVITAMIN PO) Take 1 tablet by mouth daily.   Yes [provider]    ROS: Neg HEENT Neg resp Neg cardiac Neg GI Neg psych Neg neuro  Objective:   Vitals:   11/01/23 1335  BP: 127/84  Pulse: 64  SpO2: 100%  Weight: 187 lb (84.8 kg)   Exam General appearance : Awake, alert, not in any distress. Speech Clear. Not toxic looking HEENT: Atraumatic and Normocephalic Neck: Supple, no JVD. No cervical lymphadenopathy.  Chest: Good air entry bilaterally, CTAB.  No rales/rhonchi/wheezing CVS: S1 S2 regular, no murmurs.  Back-full S&ROM.  No CVA TTP.  Neg SLR B.  +lumbar lordosis and B paraspinus spasms in lower back.  B LE DTR=intact Extremities: B/L Lower Ext shows no edema, both legs are warm to  touch Neurology: Awake alert, and oriented X 3, CN II-XII intact, Non focal Skin: No Rash  Data Review Lab Results  Component Value Date   HGBA1C 5.9 08/21/2023   HGBA1C 6.2 (H) 01/22/2023   HGBA1C 6.2 (H) 06/19/2022    Assessment & Plan   1. Urinary frequency (Primary) Increase water-no infection - POCT URINALYSIS DIP (CLINITEK) - Cervicovaginal ancillary only - Basic Metabolic Panel  2. Bilateral low back pain without sciatica, unspecified chronicity No redflags - Basic Metabolic Panel - meloxicam (MOBIC) 15 MG tablet; Take 1 tablet (15 mg total) by mouth daily. Prn pain  Dispense: 30 tablet; Refill: 3      The patient was given clear instructions to go to ER or return to medical center if symptoms don't improve, worsen or new problems develop. The patient verbalized understanding. The patient was told to call to get lab results if they haven't heard anything in the next week.      Georgian Co, PA-C [facility] [place], [place] [phone]   11/01/2023, 4:05 PM

## 2023-11-01 NOTE — Patient Instructions (Signed)
Drink 64 to 80 ounces water daily.     Back Exercises These exercises help to make your trunk and back strong. They also help to keep the lower back flexible. Doing these exercises can help to prevent or lessen pain in your lower back. If you have back pain, try to do these exercises 2-3 times each day or as told by your doctor. As you get better, do the exercises once each day. Repeat the exercises more often as told by your doctor. To stop back pain from coming back, do the exercises once each day, or as told by your doctor. Do exercises exactly as told by your doctor. Stop right away if you feel sudden pain or your pain gets worse. Exercises Single knee to chest Do these steps 3-5 times in a row for each leg: Lie on your back on a firm bed or the floor with your legs stretched out. Bring one knee to your chest. Grab your knee or thigh with both hands and hold it in place. Pull on your knee until you feel a gentle stretch in your lower back or butt. Keep doing the stretch for 10-30 seconds. Slowly let go of your leg and straighten it. Pelvic tilt Do these steps 5-10 times in a row: Lie on your back on a firm bed or the floor with your legs stretched out. Bend your knees so they point up to the ceiling. Your feet should be flat on the floor. Tighten your lower belly (abdomen) muscles to press your lower back against the floor. This will make your tailbone point up to the ceiling instead of pointing down to your feet or the floor. Stay in this position for 5-10 seconds while you gently tighten your muscles and breathe evenly. Cat-cow Do these steps until your lower back bends more easily: Get on your hands and knees on a firm bed or the floor. Keep your hands under your shoulders, and keep your knees under your hips. You may put padding under your knees. Let your head hang down toward your chest. Tighten (contract) the muscles in your belly. Point your tailbone toward the floor so your  lower back becomes rounded like the back of a cat. Stay in this position for 5 seconds. Slowly lift your head. Let the muscles of your belly relax. Point your tailbone up toward the ceiling so your back forms a sagging arch like the back of a cow. Stay in this position for 5 seconds.  Press-ups Do these steps 5-10 times in a row: Lie on your belly (face-down) on a firm bed or the floor. Place your hands near your head, about shoulder-width apart. While you keep your back relaxed and keep your hips on the floor, slowly straighten your arms to raise the top half of your body and lift your shoulders. Do not use your back muscles. You may change where you place your hands to make yourself more comfortable. Stay in this position for 5 seconds. Keep your back relaxed. Slowly return to lying flat on the floor.  Bridges Do these steps 10 times in a row: Lie on your back on a firm bed or the floor. Bend your knees so they point up to the ceiling. Your feet should be flat on the floor. Your arms should be flat at your sides, next to your body. Tighten your butt muscles and lift your butt off the floor until your waist is almost as high as your knees. If you do not  feel the muscles working in your butt and the back of your thighs, slide your feet 1-2 inches (2.5-5 cm) farther away from your butt. Stay in this position for 3-5 seconds. Slowly lower your butt to the floor, and let your butt muscles relax. If this exercise is too easy, try doing it with your arms crossed over your chest. Belly crunches Do these steps 5-10 times in a row: Lie on your back on a firm bed or the floor with your legs stretched out. Bend your knees so they point up to the ceiling. Your feet should be flat on the floor. Cross your arms over your chest. Tip your chin a little bit toward your chest, but do not bend your neck. Tighten your belly muscles and slowly raise your chest just enough to lift your shoulder blades a tiny  bit off the floor. Avoid raising your body higher than that because it can put too much stress on your lower back. Slowly lower your chest and your head to the floor. Back lifts Do these steps 5-10 times in a row: Lie on your belly (face-down) with your arms at your sides, and rest your forehead on the floor. Tighten the muscles in your legs and your butt. Slowly lift your chest off the floor while you keep your hips on the floor. Keep the back of your head in line with the curve in your back. Look at the floor while you do this. Stay in this position for 3-5 seconds. Slowly lower your chest and your face to the floor. Contact a doctor if: Your back pain gets a lot worse when you do an exercise. Your back pain does not get better within 2 hours after you exercise. If you have any of these problems, stop doing the exercises. Do not do them again unless your doctor says it is okay. Get help right away if: You have sudden, very bad back pain. If this happens, stop doing the exercises. Do not do them again unless your doctor says it is okay. This information is not intended to replace advice given to you by your health care provider. Make sure you discuss any questions you have with your health care provider. Document Revised: 11/10/2020 Document Reviewed: 11/10/2020 Elsevier Patient Education  2024 [employer].

## 2023-11-02 ENCOUNTER — Telehealth: Payer: Self-pay

## 2023-11-02 LAB — BASIC METABOLIC PANEL
BUN/Creatinine Ratio: 22 (ref 9–23)
BUN: 16 mg/dL (ref 6–24)
CO2: 25 mmol/L (ref 20–29)
Calcium: 9.8 mg/dL (ref 8.7–10.2)
Chloride: 105 mmol/L (ref 96–106)
Creatinine, Ser: 0.73 mg/dL (ref 0.57–1.00)
Glucose: 82 mg/dL (ref 70–99)
Potassium: 4.1 mmol/L (ref 3.5–5.2)
Sodium: 142 mmol/L (ref 134–144)
eGFR: 98 mL/min/{1.73_m2} (ref >59)

## 2023-11-02 LAB — CERVICOVAGINAL ANCILLARY ONLY
Bacterial Vaginitis (gardnerella): POSITIVE — AB
Candida Glabrata: NEGATIVE
Candida Vaginitis: NEGATIVE
Chlamydia: NEGATIVE
Comment: NEGATIVE
Comment: NEGATIVE
Comment: NEGATIVE
Comment: NEGATIVE
Comment: NEGATIVE
Comment: NORMAL
Neisseria Gonorrhea: NEGATIVE
Trichomonas: NEGATIVE

## 2023-11-02 NOTE — Telephone Encounter (Signed)
-----   Message from Georgian Co sent at 11/02/2023  7:15 AM EST ----- Please call patient. Blood sugar, kidney function and electrolytes were normal. Thanks, Georgian Co, PA-C

## 2023-11-02 NOTE — Telephone Encounter (Signed)
 Pt was called and is aware of results, DOB was confirmed.  ?

## 2023-11-05 ENCOUNTER — Other Ambulatory Visit: Payer: Self-pay | Admitting: Physician Assistant

## 2023-11-05 ENCOUNTER — Telehealth: Payer: Self-pay

## 2023-11-05 ENCOUNTER — Encounter: Payer: Self-pay | Admitting: Physician Assistant

## 2023-11-05 MED ORDER — METRONIDAZOLE 500 MG PO TABS: 500.0000 mg | ORAL_TABLET | Freq: Two times a day (BID) | ORAL | 0 refills | Status: DC

## 2023-11-05 NOTE — Telephone Encounter (Signed)
 Patient viewed results and Doctor comment through [facility]

## 2023-11-05 NOTE — Telephone Encounter (Signed)
-----   Message from Georgian Co sent at 11/05/2023  7:50 AM EST ----- Your vaginal swab was positive for bacterial vaginitis.  This is an imbalance in the bacteria in the vagina.  No yeast.  No STD(negative for chlamydia, gonorrhea, trichomonas).  I sent your an antibiotic for the bacterial vaginitis.  Thanks, Georgian Co, PA-C

## 2023-11-08 ENCOUNTER — Ambulatory Visit: Payer: 59 | Admitting: Physician Assistant

## 2023-11-14 ENCOUNTER — Ambulatory Visit: Payer: 59 | Admitting: Physician Assistant

## 2023-11-21 ENCOUNTER — Other Ambulatory Visit: Payer: Self-pay | Admitting: Internal Medicine

## 2023-11-21 DIAGNOSIS — Z1231 Encounter for screening mammogram for malignant neoplasm of breast: Secondary | ICD-10-CM

## 2024-01-09 ENCOUNTER — Ambulatory Visit
Admission: RE | Admit: 2024-01-09 | Discharge: 2024-01-09 | Disposition: A | Discharge status: Home / Self Care | Source: Ambulatory Visit | Attending: Internal Medicine | Admitting: Internal Medicine

## 2024-01-09 DIAGNOSIS — Z1231 Encounter for screening mammogram for malignant neoplasm of breast: Secondary | ICD-10-CM

## 2024-01-10 ENCOUNTER — Encounter: Payer: Self-pay | Admitting: Internal Medicine

## 2024-01-28 ENCOUNTER — Ambulatory Visit: Payer: Self-pay | Admitting: Cardiology

## 2024-02-12 ENCOUNTER — Encounter: Payer: Self-pay | Admitting: Cardiology

## 2024-02-12 ENCOUNTER — Ambulatory Visit: Attending: Cardiology | Admitting: Cardiology

## 2024-02-12 VITALS — BP 122/70 | HR 63 | Resp 16 | Ht 64.0 in | Wt 193.0 lb

## 2024-02-12 DIAGNOSIS — Z8249 Family history of ischemic heart disease and other diseases of the circulatory system: Secondary | ICD-10-CM

## 2024-02-12 DIAGNOSIS — E7800 Pure hypercholesterolemia, unspecified: Secondary | ICD-10-CM | POA: Diagnosis not present

## 2024-02-12 NOTE — Patient Instructions (Signed)
 Medication Instructions:  No medication changes were made at this visit. Continue current regimen.   *If you need a refill on your cardiac medications before your next appointment, please call your pharmacy*  Lab Work: None ordered today. If you have labs (blood work) drawn today and your tests are completely normal, you will receive your results only by: MyChart Message (if you have MyChart) OR A paper copy in the mail If you have any lab test that is abnormal or we need to change your treatment, we will call you to review the results.  Testing/Procedures: Your physician has requested that you have a coronary calcium score performed. This is not covered by insurance and will be an out-of-pocket cost of approximately $99.   Follow-Up: At [facility], you and your health needs are our priority.  As part of our continuing mission to provide you with exceptional heart care, our providers are all part of one team.  This team includes your primary Cardiologist (physician) and Advanced Practice Providers or APPs (Physician Assistants and Nurse Practitioners) who all work together to provide you with the care you need, when you need it.  Your next appointment:   1 year(s)  Provider:   Olinda Bertrand, DO

## 2024-02-12 NOTE — Progress Notes (Signed)
 Cardiology Office Note:  .   Date:  02/12/2024  ID:  Jo Williamson, DOB 1971/04/25, MRN [id number] PCP:  Lawrance Presume, MD  Former Cardiology Providers: NA [facility] Providers Cardiologist:  Olinda Bertrand, DO , [facility] (established care 02/12/24) Electrophysiologist:  None  Click to update primary MD,subspecialty MD or APP then REFRESH:1}    Chief Complaint  Patient presents with   Follow-up    Annual follow up - HLD and family history CAD    History of Present Illness: .   Jo Williamson is a 53 y.o. African-American female whose past medical history and cardiovascular risk factors includes: Hyperlipidemia, family history of premature CAD, obesity.  Patient is being followed by the practice given her history of hyperlipidemia and family history of premature coronary artery disease. She is here for 1 year follow-up visit.  Since last office visit patient denies any anginal chest pain or heart failure symptoms.  No hospitalizations or urgent care visits for cardiovascular reasons.  Will be seeing her PCP later this this month for fasting lipids.  She is going to [place] for her 53th birthday. Goes to [employer] 4 days a week for - treadmill.  Overall physical endurance remains stable.  Review of Systems: .   Review of Systems  Cardiovascular:  Negative for chest pain, claudication, irregular heartbeat, leg swelling, near-syncope, orthopnea, palpitations, paroxysmal nocturnal dyspnea and syncope.  Respiratory:  Negative for shortness of breath.   Hematologic/Lymphatic: Negative for bleeding problem.    Studies Reviewed:   EKG: EKG Interpretation Date/Time:  Tuesday February 12 2024 08:24:52 EDT Ventricular Rate:  64 PR Interval:  174 QRS Duration:  72 QT Interval:  378 QTC Calculation: 389 R Axis:   46  Text Interpretation: Normal sinus rhythm Normal ECG No previous ECGs available Confirmed by Olinda Bertrand [phone]) on 02/12/2024 8:34:34  AM  Echocardiogram: 04/05/2020: LVEF 60-65%, LV cavity normal in size, mild LVH, normal wall motion, no significant valvular abnormality.   Stress Testing: Exercise treadmill stress test 04/05/2020:  Exercise treadmill stress test performed using Bruce protocol.  Patient reached 7.4 METS, and 76% of age predicted maximum heart rate.  Exercise capacity was low.  No chest pain reported. Attenuated heart rate and blood pressure response to exercise.  Stress EKG with submaximal exercise revealed no ischemic changes.  Inconclusive study. Consider alternate diagnostic tests, if clinically indicated.    Coronary artery calcification scoring performed on April 01, 2020 at [facility]: Total calcium score:0 AU.  Impression: No identifiable calcified coronary artery plaque. Very low, generally less than 5% risk of coronary artery disease.   RADIOLOGY: NA  Risk Assessment/Calculations:   NA   Labs:       Latest Ref Rng & Units 11/01/2023    2:18 PM 12/15/2021    9:35 AM  BMP  Glucose 70 - 99 mg/dL 82  92   BUN 6 - 24 mg/dL 16  17   Creatinine 6.21 - 1.00 mg/dL 3.08  6.57   BUN/Creat Ratio 9 - 23 22  22    Sodium 134 - 144 mmol/L 142  144   Potassium 3.5 - 5.2 mmol/L 4.1  4.1   Chloride 96 - 106 mmol/L 105  107   CO2 20 - 29 mmol/L 25  23   Calcium 8.7 - 10.2 mg/dL 9.8  9.6     Lab Results  Component Value Date   CHOL 208 (H) 12/15/2021   HDL 60 12/15/2021   LDLCALC  139 (H) 12/15/2021   LDLDIRECT 154 (H) 04/12/2021   TRIG 51 12/15/2021   CHOLHDL 3.5 12/15/2021   No results for input(s): "LIPOA" in the last 8760 hours. No components found for: "NTPROBNP" No results for input(s): "PROBNP" in the last 8760 hours. No results for input(s): "TSH" in the last 8760 hours.  External Labs: Collected: 03/04/2020 Creatinine 0.66 mg/dL. eGFR: 119 mL/min per 1.73 m Lipid profile: Total cholesterol 233, triglycerides 67, HDL 58, LDL 159, non-HDL 175 TSH: 5.28   Collected:  December 15, 2021 Total cholesterol 2 8, triglycerides 51, HDL L60, LDL 139     Physical Exam:  Today's Vitals   02/12/24 0822  BP: 122/70  Pulse: 63  Resp: 16  SpO2: 97%  Weight: 193 lb (87.5 kg)  Height: 5\' 4"  (1.626 m)   Body mass index is 33.13 kg/m. Wt Readings from Last 3 Encounters:  02/12/24 193 lb (87.5 kg)  11/01/23 187 lb (84.8 kg)  08/21/23 189 lb (85.7 kg)    Physical Exam  Constitutional: No distress.  hemodynamically stable  Neck: No JVD present.  Cardiovascular: Normal rate, regular rhythm, S1 normal and S2 normal. Exam reveals no gallop, no S3 and no S4.  No murmur heard. Pulmonary/Chest: Effort normal and breath sounds normal. No stridor. She has no wheezes. She has no rales.  Musculoskeletal:        General: No edema.     Cervical back: Neck supple.  Skin: Skin is warm.    Impression:   ICD-10-CM   1. Hypercholesterolemia  E78.00 CT CARDIAC SCORING (SELF PAY ONLY)    2. Family history of premature CAD  Z82.49 EKG 12-Lead    CT CARDIAC SCORING (SELF PAY ONLY)       Recommendation(s):  Hypercholesterolemia No recent labs available for review to evaluate her most recent lipids. Follows up with PCP by the end of this month for repeat lipids. Based on the most recent lipid profile from 2023 her ASCVD risk score was less than 3% and her last coronary calcium score was 0.  She is working on lifestyle modifications to improve her lipids. She is more physically active than she was last time goes to the gym at least 4 days a week for 45 minutes. Will repeat coronary calcium score in 1 year (2026) prior to next office visit as a 5-year follow-up study.  Family history of premature CAD Reemphasized importance of improving her modifiable cardiovascular risk factors We will repeat a coronary calcium score prior to the next office visit in 2026 Reemphasized the importance of secondary prevention with focus on improving her modifiable cardiovascular risk factors such as  glycemic control, lipid management, blood pressure control, and weight loss.  Orders Placed:  Orders Placed This Encounter  Procedures   CT CARDIAC SCORING (SELF PAY ONLY)    Standing Status:   Future    Expected Date:   02/19/2024    Expiration Date:   02/11/2025    [facility]    Is patient pregnant?:   No   EKG 12-Lead     Final Medication List:   No orders of the defined types were placed in this encounter.   Medications Discontinued During This Encounter  Medication Reason   meloxicam  (MOBIC ) 15 MG tablet Patient Preference   metroNIDAZOLE  (FLAGYL ) 500 MG tablet Patient Preference     Current Outpatient Medications:    Ascorbic Acid (VITAMIN C) 1000 MG tablet, Take 1,000  mg by mouth daily., Disp: , Rfl:    Multiple Vitamins-Minerals (WOMENS MULTIVITAMIN PO), Take 1 tablet by mouth daily., Disp: , Rfl:   Consent:   NA  Disposition:   1 year   Her questions and concerns were addressed to her satisfaction. She voices understanding of the recommendations provided during this encounter.    Signed, Awilda Bogus, [facility]  A Division of [facility] [address] [place],  [address]  [place], [place] [address] 02/12/2024 8:54 AM

## 2024-02-19 ENCOUNTER — Encounter: Payer: 59 | Admitting: Internal Medicine

## 2024-04-03 ENCOUNTER — Telehealth: Payer: Self-pay | Admitting: Internal Medicine

## 2024-04-03 NOTE — Telephone Encounter (Signed)
 Called pt to confirm appt for  7/24

## 2024-04-04 ENCOUNTER — Ambulatory Visit: Attending: Internal Medicine | Admitting: Internal Medicine

## 2024-04-04 VITALS — BP 105/70 | HR 57 | Ht 64.0 in | Wt 186.0 lb

## 2024-04-04 DIAGNOSIS — R232 Flushing: Secondary | ICD-10-CM | POA: Diagnosis not present

## 2024-04-04 DIAGNOSIS — Z6831 Body mass index (BMI) 31.0-31.9, adult: Secondary | ICD-10-CM

## 2024-04-04 DIAGNOSIS — Z2821 Immunization not carried out because of patient refusal: Secondary | ICD-10-CM

## 2024-04-04 DIAGNOSIS — E669 Obesity, unspecified: Secondary | ICD-10-CM

## 2024-04-04 DIAGNOSIS — R7303 Prediabetes: Secondary | ICD-10-CM

## 2024-04-04 DIAGNOSIS — Z0000 Encounter for general adult medical examination without abnormal findings: Secondary | ICD-10-CM | POA: Diagnosis not present

## 2024-04-04 DIAGNOSIS — Z8249 Family history of ischemic heart disease and other diseases of the circulatory system: Secondary | ICD-10-CM

## 2024-04-04 DIAGNOSIS — E66811 Obesity, class 1: Secondary | ICD-10-CM | POA: Diagnosis not present

## 2024-04-04 NOTE — Patient Instructions (Signed)
 VISIT SUMMARY:  You came in today for your annual physical exam to check your overall health, including cancer screenings and vaccinations. We discussed your history of prediabetes, menopausal symptoms, and family history of heart disease. You have been managing your weight well and maintaining a healthy lifestyle. Your mammogram is up to date, and you are consistent with your eye and dental exams.  YOUR PLAN:  -PREDIABETES: Prediabetes means your blood sugar levels are higher than normal but not high enough to be classified as diabetes. You have been managing it well with weight loss and lifestyle changes. We will check your A1c level and perform baseline blood tests including cholesterol, kidney, liver function tests, blood count, and A1c.  -MENOPAUSAL SYMPTOMS: Menopausal symptoms like hot flashes are common as you age and your hormone levels change. You are managing these symptoms without medication by avoiding caffeine and using caffeine-free teas.  -GENERAL HEALTH MAINTENANCE: Your annual physical exam is complete, and you are up to date with your mammogram. Since you had a hysterectomy, you do not need Pap smears. You received the COVID-19 vaccine last fall and declined the Hepatitis B vaccine. You are maintaining regular eye and dental exams. We discussed your family history of premature heart disease and the importance of regular cardiology follow-ups.  INSTRUCTIONS:  Please schedule your cardiac CT before your next cardiology follow-up with Dr. Joaquin next year. Consider getting the COVID-19 vaccine next fall.

## 2024-04-04 NOTE — Progress Notes (Signed)
 Patient ID: Jo Williamson, female    DOB: Apr 26, 1971  MRN: [id number]  CC: Annual Exam (Physical. /No questions / concerns/No to hep B vax)   Subjective: Jo Williamson is a 53 y.o. female who presents for physical. Her concerns today include:  Fhx of premature CAD (followed by Dr. Michele), over wgh, HL, PreDM   Discussed the use of AI scribe software for clinical note transcription with the patient, who gave verbal consent to proceed.  History of Present Illness Jo Williamson is a 53 year old female who presents for an annual physical exam.  She is attending her annual physical exam to assess her overall health status, including cancer screenings and vaccinations. She underwent a mammogram in April and has a history of a hysterectomy for non-cancerous fibroids, with a prior history of heavy menstrual bleeding. She declines hep B vaccine series.  Obesity/PreDM: She has a history of prediabetes and has been focusing on weight management. Since December, her weight has decreased from 189 lbs to 186 lbs. She exercises four days a week, primarily walking, and aims for over 10,000 steps daily. Her diet includes salads, baked and roasted foods, and high-fiber fruits and vegetables. She avoids sugary drinks, occasionally having ginger ale.  She experiences menopausal symptoms, specifically hot flashes, which have not worsened since her last visit. She manages these symptoms without medication and avoids caffeine, opting for caffeine-free teas. We had discuss Veozah on last visit.  She is still not interested at this point.  She has a family history of premature heart disease, with her mother, maternal uncle, and maternal grandmother all having died from massive heart attacks. Her mother was 31, and her uncle was in his 77s at the time of their deaths. She sees a cardiologist Dr. Michele annually and had her last visit in June. He ordered a cardiac CT.  No issues with mental health, hearing, nasal  or sinus congestion, swallowing, chronic cough, shortness of breath, chest pain, or bowel movements. No blood in stools. She sees an eye doctor annually and had her last exam in February. She also visits the dentist twice a year; brushes and flosses regularly.    Patient Active Problem List   Diagnosis Date Noted   Overweight (BMI 25.0-29.9) 12/15/2021     Current Outpatient Medications on File Prior to Visit  Medication Sig Dispense Refill   Ascorbic Acid (VITAMIN C) 1000 MG tablet Take 1,000 mg by mouth daily.     Multiple Vitamins-Minerals (WOMENS MULTIVITAMIN PO) Take 1 tablet by mouth daily.     No current facility-administered medications on file prior to visit.    Allergies  Allergen Reactions   Shrimp [Shellfish Allergy] Hives    Social History   Socioeconomic History   Marital status: Divorced    Spouse name: Not on file   Number of children: 2   Years of education: Not on file   Highest education level: Bachelor's degree (e.g., BA, AB, BS)  Occupational History   Occupation: Forensic psychologist for [employer]  Tobacco Use   Smoking status: Never   Smokeless tobacco: Never  Vaping Use   Vaping status: Never Used  Substance and Sexual Activity   Alcohol use: Yes    Comment: occ   Drug use: Not Currently   Sexual activity: Not on file  Other Topics Concern   Not on file  Social History Narrative   Not on file   Social Drivers of Health   Financial  Resource Strain: Low Risk  (03/31/2024)   Overall Financial Resource Strain (CARDIA)    Difficulty of Paying Living Expenses: Not hard at all  Food Insecurity: No Food Insecurity (03/31/2024)   Hunger Vital Sign    Worried About Running Out of Food in the Last Year: Never true    Ran Out of Food in the Last Year: Never true  Transportation Needs: No Transportation Needs (03/31/2024)   PRAPARE - Administrator, Civil Service (Medical): No    Lack of Transportation (Non-Medical): No   Physical Activity: Insufficiently Active (03/31/2024)   Exercise Vital Sign    Days of Exercise per Week: 4 days    Minutes of Exercise per Session: 30 min  Stress: No Stress Concern Present (03/31/2024)   [employer] of Occupational Health - Occupational Stress Questionnaire    Feeling of Stress: Not at all  Social Connections: Moderately Integrated (03/31/2024)   Social Connection and Isolation Panel    Frequency of Communication with Friends and Family: More than three times a week    Frequency of Social Gatherings with Friends and Family: More than three times a week    Attends Religious Services: More than 4 times per year    Active Member of [employer] or Organizations: Yes    Attends Engineer, structural: More than 4 times per year    Marital Status: Divorced  Intimate Partner Violence: Not At Risk (08/21/2023)   Humiliation, Afraid, Rape, and Kick questionnaire    Fear of Current or Ex-Partner: No    Emotionally Abused: No    Physically Abused: No    Sexually Abused: No    Family History  Problem Relation Age of Onset   Heart attack Mother    Diabetes Father     Past Surgical History:  Procedure Laterality Date   ABDOMINAL HYSTERECTOMY     CESAREAN SECTION     HYSTERECTOMY ABDOMINAL WITH SALPINGECTOMY      ROS:  Negative except as stated above  PHYSICAL EXAM: BP 105/70 (BP Location: Left Arm, Patient Position: Sitting, Cuff Size: Normal)   Pulse (!) 57   Ht 5' 4 (1.626 m)   Wt 186 lb (84.4 kg)   SpO2 100%   BMI 31.93 kg/m   Wt Readings from Last 3 Encounters:  04/04/24 186 lb (84.4 kg)  02/12/24 193 lb (87.5 kg)  11/01/23 187 lb (84.8 kg)    Physical Exam  General appearance - alert, well appearing, middle age AAF  and in no distress Mental status - normal mood, behavior, speech, dress, motor activity, and thought processes Eyes - pupils equal and reactive, extraocular eye movements intact Ears - bilateral TM's and external ear canals  normal Nose - normal and patent, no erythema, discharge or polyps Mouth - mucous membranes moist, pharynx normal without lesions Neck - supple, no significant adenopathy Lymphatics - no palpable lymphadenopathy, no hepatosplenomegaly Chest - clear to auscultation, no wheezes, rales or rhonchi, symmetric air entry Heart - normal rate, regular rhythm, normal S1, S2, no murmurs, rubs, clicks or gallops Abdomen - soft, nontender, nondistended, no masses or organomegaly Neurological - cranial nerves II through XII intact, motor and sensory grossly normal bilaterally Musculoskeletal - no joint tenderness, deformity or swelling Extremities - peripheral pulses normal, no pedal edema, no clubbing or cyanosis Skin - no abnormal lesions seen on exposed areas of skin      Latest Ref Rng & Units 11/01/2023    2:18 PM 12/15/2021  9:35 AM  CMP  Glucose 70 - 99 mg/dL 82  92   BUN 6 - 24 mg/dL 16  17   Creatinine 9.42 - 1.00 mg/dL 9.26  9.23   Sodium 865 - 144 mmol/L 142  144   Potassium 3.5 - 5.2 mmol/L 4.1  4.1   Chloride 96 - 106 mmol/L 105  107   CO2 20 - 29 mmol/L 25  23   Calcium 8.7 - 10.2 mg/dL 9.8  9.6   Total Protein 6.0 - 8.5 g/dL  6.9   Total Bilirubin 0.0 - 1.2 mg/dL  0.2   Alkaline Phos 44 - 121 IU/L  49   AST 0 - 40 IU/L  17   ALT 0 - 32 IU/L  14    Lipid Panel     Component Value Date/Time   CHOL 208 (H) 12/15/2021 0935   TRIG 51 12/15/2021 0935   HDL 60 12/15/2021 0935   CHOLHDL 3.5 12/15/2021 0935   LDLCALC 139 (H) 12/15/2021 0935   LDLDIRECT 154 (H) 04/12/2021 0826    CBC    Component Value Date/Time   WBC 3.6 06/19/2022 0923   RBC 4.83 06/19/2022 0923   HGB 13.2 06/19/2022 0923   HCT 40.5 06/19/2022 0923   PLT 328 06/19/2022 0923   MCV 84 06/19/2022 0923   MCH 27.3 06/19/2022 0923   MCHC 32.6 06/19/2022 0923   RDW 13.1 06/19/2022 0923   LYMPHSABS 1.5 06/19/2022 0923   EOSABS 0.1 06/19/2022 0923   BASOSABS 0.0 06/19/2022 0923    ASSESSMENT AND PLAN: 1.  Annual physical exam (Primary) Encourage patient to continue healthy eating habits and regular exercise  2. Obesity (BMI 30.0-34.9) See #1 above. - CBC - Comprehensive metabolic panel with GFR - Lipid panel - Hemoglobin A1c  3. Prediabetes See #1 above. - Hemoglobin A1c  4. Hot flashes Advised to avoid caffeinated beverages and hot fluids.  Try to wear close with breathable material like cotton.  Sleep with thermostat set to a comfortable temperature  5. Family history of premature CAD Followed by cardiology.  We will check lipid profile today  6. Hepatitis B vaccination declined Recommended.  Patient declined.  Patient was given the opportunity to ask questions.  Patient verbalized understanding of the plan and was able to repeat key elements of the plan.   This documentation was completed using Paediatric nurse.  Any transcriptional errors are unintentional.  Orders Placed This Encounter  Procedures   CBC   Comprehensive metabolic panel with GFR   Lipid panel   Hemoglobin A1c     Requested Prescriptions    No prescriptions requested or ordered in this encounter    Return in about 6 months (around 10/05/2024).  Barnie Louder, MD, FACP

## 2024-04-05 ENCOUNTER — Ambulatory Visit: Payer: Self-pay | Admitting: Internal Medicine

## 2024-04-05 LAB — CBC
Hematocrit: 42 % (ref 34.0–46.6)
Hemoglobin: 13.3 g/dL (ref 11.1–15.9)
MCH: 27.2 pg (ref 26.6–33.0)
MCHC: 31.7 g/dL (ref 31.5–35.7)
MCV: 86 fL (ref 79–97)
Platelets: 323 x10E3/uL (ref 150–450)
RBC: 4.89 x10E6/uL (ref 3.77–5.28)
RDW: 12.8 % (ref 11.7–15.4)
WBC: 3.3 x10E3/uL — ABNORMAL LOW (ref 3.4–10.8)

## 2024-04-05 LAB — COMPREHENSIVE METABOLIC PANEL WITH GFR
ALT: 14 IU/L (ref 0–32)
AST: 20 IU/L (ref 0–40)
Albumin: 4.4 g/dL (ref 3.8–4.9)
Alkaline Phosphatase: 46 IU/L (ref 44–121)
BUN/Creatinine Ratio: 21 (ref 9–23)
BUN: 16 mg/dL (ref 6–24)
Bilirubin Total: 0.2 mg/dL (ref 0.0–1.2)
CO2: 22 mmol/L (ref 20–29)
Calcium: 9.9 mg/dL (ref 8.7–10.2)
Chloride: 104 mmol/L (ref 96–106)
Creatinine, Ser: 0.77 mg/dL (ref 0.57–1.00)
Globulin, Total: 2.9 g/dL (ref 1.5–4.5)
Glucose: 90 mg/dL (ref 70–99)
Potassium: 4.3 mmol/L (ref 3.5–5.2)
Sodium: 143 mmol/L (ref 134–144)
Total Protein: 7.3 g/dL (ref 6.0–8.5)
eGFR: 91 mL/min/1.73 (ref >59)

## 2024-04-05 LAB — HEMOGLOBIN A1C
Est. average glucose Bld gHb Est-mCnc: 128 mg/dL
Hgb A1c MFr Bld: 6.1 % — ABNORMAL HIGH (ref 4.8–5.6)

## 2024-04-05 LAB — LIPID PANEL
Chol/HDL Ratio: 4.1 ratio (ref 0.0–4.4)
Cholesterol, Total: 221 mg/dL — ABNORMAL HIGH (ref 100–199)
HDL: 54 mg/dL (ref >39)
LDL Chol Calc (NIH): 155 mg/dL — ABNORMAL HIGH (ref 0–99)
Triglycerides: 70 mg/dL (ref 0–149)
VLDL Cholesterol Cal: 12 mg/dL (ref 5–40)

## 2024-04-09 ENCOUNTER — Other Ambulatory Visit: Payer: Self-pay | Admitting: Internal Medicine

## 2024-04-09 DIAGNOSIS — D72819 Decreased white blood cell count, unspecified: Secondary | ICD-10-CM

## 2024-10-06 ENCOUNTER — Ambulatory Visit: Admitting: Internal Medicine

## 2024-10-20 ENCOUNTER — Ambulatory Visit: Admitting: Internal Medicine

## 2025-01-12 ENCOUNTER — Other Ambulatory Visit (HOSPITAL_COMMUNITY)
